# Patient Record
Sex: Male | Born: 1960 | Race: Asian | Marital: Married | Smoking: Never smoker
Health system: Southern US, Community
[De-identification: ages and names within clinical notes are randomized; demographics above are authoritative.]

## PROBLEM LIST (undated history)

## (undated) DIAGNOSIS — R079 Chest pain, unspecified: Secondary | ICD-10-CM

## (undated) DIAGNOSIS — I10 Essential (primary) hypertension: Secondary | ICD-10-CM

## (undated) DIAGNOSIS — K219 Gastro-esophageal reflux disease without esophagitis: Secondary | ICD-10-CM

## (undated) DIAGNOSIS — M1990 Unspecified osteoarthritis, unspecified site: Secondary | ICD-10-CM

## (undated) DIAGNOSIS — R0609 Other forms of dyspnea: Secondary | ICD-10-CM

## (undated) HISTORY — PX: FRACTURE SURGERY: SHX138

## (undated) HISTORY — PX: HIP SURGERY: SHX245

## (undated) HISTORY — DX: Other forms of dyspnea: R06.09

## (undated) HISTORY — DX: Unspecified osteoarthritis, unspecified site: M19.90

## (undated) HISTORY — DX: Chest pain, unspecified: R07.9

## (undated) HISTORY — DX: Gastro-esophageal reflux disease without esophagitis: K21.9

---

## 2012-10-02 ENCOUNTER — Encounter (HOSPITAL_COMMUNITY): Payer: Self-pay | Admitting: Emergency Medicine

## 2012-10-02 ENCOUNTER — Emergency Department (HOSPITAL_COMMUNITY): Payer: BC Managed Care – PPO

## 2012-10-02 ENCOUNTER — Observation Stay (HOSPITAL_COMMUNITY)
Admission: EM | Admit: 2012-10-02 | Discharge: 2012-10-03 | Disposition: A | Discharge status: Home / Self Care | Payer: BC Managed Care – PPO | Attending: Emergency Medicine | Admitting: Emergency Medicine

## 2012-10-02 DIAGNOSIS — Z8719 Personal history of other diseases of the digestive system: Secondary | ICD-10-CM

## 2012-10-02 DIAGNOSIS — Z79899 Other long term (current) drug therapy: Secondary | ICD-10-CM | POA: Insufficient documentation

## 2012-10-02 DIAGNOSIS — R0789 Other chest pain: Principal | ICD-10-CM | POA: Insufficient documentation

## 2012-10-02 DIAGNOSIS — I10 Essential (primary) hypertension: Secondary | ICD-10-CM | POA: Insufficient documentation

## 2012-10-02 DIAGNOSIS — E876 Hypokalemia: Secondary | ICD-10-CM | POA: Insufficient documentation

## 2012-10-02 DIAGNOSIS — R079 Chest pain, unspecified: Secondary | ICD-10-CM

## 2012-10-02 HISTORY — DX: Essential (primary) hypertension: I10

## 2012-10-02 LAB — CBC WITH DIFFERENTIAL/PLATELET
Eosinophils Relative: 2 % (ref 0–5)
HCT: 42.7 % (ref 39.0–52.0)
Hemoglobin: 15.2 g/dL (ref 13.0–17.0)
Lymphocytes Relative: 27 % (ref 12–46)
Lymphs Abs: 2 10*3/uL (ref 0.7–4.0)
MCV: 84.1 fL (ref 78.0–100.0)
Monocytes Absolute: 0.7 10*3/uL (ref 0.1–1.0)
Monocytes Relative: 10 % (ref 3–12)
Platelets: 248 10*3/uL (ref 150–400)
RBC: 5.08 MIL/uL (ref 4.22–5.81)
WBC: 7.3 10*3/uL (ref 4.0–10.5)

## 2012-10-02 LAB — COMPREHENSIVE METABOLIC PANEL
Albumin: 4.4 g/dL (ref 3.5–5.2)
BUN: 13 mg/dL (ref 6–23)
Creatinine, Ser: 0.93 mg/dL (ref 0.50–1.35)
Total Protein: 8.1 g/dL (ref 6.0–8.3)

## 2012-10-02 LAB — POCT I-STAT TROPONIN I: Troponin i, poc: 0.01 ng/mL (ref 0.00–0.08)

## 2012-10-02 MED ORDER — ASPIRIN 81 MG PO CHEW
324.0000 mg | CHEWABLE_TABLET | Freq: Once | ORAL | Status: AC
  Administered 2012-10-02: 324 mg via ORAL
  Filled 2012-10-02: qty 4

## 2012-10-02 MED ORDER — KETOROLAC TROMETHAMINE 30 MG/ML IJ SOLN
30.0000 mg | Freq: Once | INTRAMUSCULAR | Status: AC
  Administered 2012-10-02: 30 mg via INTRAVENOUS
  Filled 2012-10-02: qty 1

## 2012-10-02 MED ORDER — MORPHINE SULFATE 4 MG/ML IJ SOLN
4.0000 mg | Freq: Once | INTRAMUSCULAR | Status: AC
  Administered 2012-10-02: 4 mg via INTRAVENOUS
  Filled 2012-10-02: qty 1

## 2012-10-02 NOTE — ED Notes (Signed)
PT. REPORTS MID / LEFT CHEST PAIN " DISCOMFORT"  ONSET YESTERDAY WITH SLIGHT SOB , NO COUGH , NO NAUSEA OR DIAPHORESIS, ALSO REPORTS ELEVATED BLOOD PRESSURE TODAY = 145/98.

## 2012-10-02 NOTE — ED Notes (Signed)
Report received from Donna RN

## 2012-10-02 NOTE — ED Provider Notes (Signed)
History     CSN: [id number]  Arrival date & time 10/02/12  1955   First MD Initiated Contact with Patient 10/02/12 2031      Chief Complaint  Patient presents with  . Chest Pain    (Consider location/radiation/quality/duration/timing/severity/associated sxs/prior treatment) HPI Comments: Patient with only pmh being htn, presents with complaints of heaviness in the front of his chest since yesterday.  He denies radiation into his arm or jaw.  There is no shortness of breath and no exertional component.  He reports that he has been under a lot of stress at work recently.  He had a similar episode about 8 years ago and had a stress test that was negative.    He reports his father had heart disease diagnosed at the age of 9.  Patient is a 51 y.o. male presenting with chest pain. The history is provided by the patient.  Chest Pain The chest pain began yesterday. Chest pain occurs intermittently. The chest pain is worsening. The pain is associated with stress. The severity of the pain is moderate. The quality of the pain is described as heavy. The pain does not radiate. Chest pain is worsened by stress. Pertinent negatives for primary symptoms include no fever, no shortness of breath, no cough, no nausea and no vomiting. He tried nothing for the symptoms. Risk factors: htn, family history.     Past Medical History  Diagnosis Date  . Hypertension     Past Surgical History  Procedure Date  . Hip surgery     No family history on file.  History  Substance Use Topics  . Smoking status: Never Smoker   . Smokeless tobacco: Not on file  . Alcohol Use: No      Review of Systems  Constitutional: Negative for fever.  Respiratory: Negative for cough and shortness of breath.   Cardiovascular: Positive for chest pain.  Gastrointestinal: Negative for nausea and vomiting.  All other systems reviewed and are negative.    Allergies  Review of patient's allergies indicates not on  file.  Home Medications   Current Outpatient Rx  Name  Route  Sig  Dispense  Refill  . LISINOPRIL 10 MG PO TABS   Oral   Take 10 mg by mouth daily.         Marland Kitchen METOPROLOL SUCCINATE ER 50 MG PO TB24   Oral   Take 50 mg by mouth daily. Take with or immediately following a meal.           BP 147/94  Pulse 112  Temp 98.1 F (36.7 C) (Oral)  Resp 20  SpO2 99%  Physical Exam  Nursing note and vitals reviewed. Constitutional: He is oriented to person, place, and time. He appears well-developed and well-nourished. No distress.  HENT:  Head: Normocephalic and atraumatic.  Mouth/Throat: Oropharynx is clear and moist.  Neck: Normal range of motion. Neck supple.  Cardiovascular: Normal rate and regular rhythm.   No murmur heard. Pulmonary/Chest: Effort normal and breath sounds normal. No respiratory distress.  Abdominal: Soft. Bowel sounds are normal. He exhibits no distension. There is no tenderness.  Musculoskeletal: Normal range of motion. He exhibits no edema.  Lymphadenopathy:    He has no cervical adenopathy.  Neurological: He is alert and oriented to person, place, and time.  Skin: Skin is warm and dry. He is not diaphoretic.    ED Course  Procedures (including critical care time)   Labs Reviewed  CBC WITH DIFFERENTIAL  POCT  I-STAT TROPONIN I  COMPREHENSIVE METABOLIC PANEL  TROPONIN I   Dg Chest 2 View  10/02/2012  *RADIOLOGY REPORT*  Clinical Data: 2-day history of mid chest pain.  Dizziness.  CHEST - 2 VIEW  Comparison: None.  Findings: Cardiac silhouette normal in size.  Thoracic aorta mildly tortuous.  Hilar and mediastinal contours otherwise unremarkable. Lungs clear.  Bronchovascular markings normal.  Pulmonary vascularity normal.  No pneumothorax.  No pleural effusions.  Mild degenerative changes involving the thoracic spine.  IMPRESSION: No acute cardiopulmonary disease.   Original Report Authenticated By: Hulan Saas, M.D.      No diagnosis  found.   Date: 10/02/2012  Rate: 112  Rhythm: sinus tachycardia  QRS Axis: normal  Intervals: normal  ST/T Wave abnormalities: normal  Conduction Disutrbances:none  Narrative Interpretation:   Old EKG Reviewed: none available    MDM  The patient presents with chest pain and risk factors of htn and family history.  The workup is negative and he is pain-free with toradol and morphine.  I have discussed this with him and we have decided to do cdu observation.  His bmi is 29.6.  Report given to D. Smith.        Geoffery Lyons, MD 10/02/12 [phone]

## 2012-10-03 ENCOUNTER — Encounter (HOSPITAL_COMMUNITY): Payer: Self-pay

## 2012-10-03 ENCOUNTER — Observation Stay (HOSPITAL_COMMUNITY): Payer: BC Managed Care – PPO

## 2012-10-03 MED ORDER — SODIUM CHLORIDE 0.9 % IV SOLN
20.0000 mL | INTRAVENOUS | Status: DC
  Administered 2012-10-03: 20 mL via INTRAVENOUS

## 2012-10-03 MED ORDER — METOPROLOL TARTRATE 1 MG/ML IV SOLN
INTRAVENOUS | Status: AC
  Filled 2012-10-03: qty 10

## 2012-10-03 MED ORDER — OMEPRAZOLE 20 MG PO CPDR: 20.0000 mg | DELAYED_RELEASE_CAPSULE | Freq: Every day | ORAL | Status: DC

## 2012-10-03 MED ORDER — IOHEXOL 350 MG/ML SOLN
80.0000 mL | Freq: Once | INTRAVENOUS | Status: AC | PRN
  Administered 2012-10-03: 80 mL via INTRAVENOUS

## 2012-10-03 MED ORDER — NITROGLYCERIN 0.4 MG SL SUBL
0.4000 mg | SUBLINGUAL_TABLET | Freq: Once | SUBLINGUAL | Status: AC
  Administered 2012-10-03: 0.4 mg via SUBLINGUAL

## 2012-10-03 MED ORDER — METOPROLOL TARTRATE 25 MG PO TABS
50.0000 mg | ORAL_TABLET | Freq: Once | ORAL | Status: AC
Start: 2012-10-03 — End: 2012-10-03
  Administered 2012-10-03: 50 mg via ORAL
  Filled 2012-10-03: qty 2

## 2012-10-03 MED ORDER — NITROGLYCERIN 0.4 MG SL SUBL
SUBLINGUAL_TABLET | SUBLINGUAL | Status: AC
  Administered 2012-10-03: 0.4 mg via SUBLINGUAL
  Filled 2012-10-03: qty 25

## 2012-10-03 MED ORDER — METHOCARBAMOL 500 MG PO TABS: 500.0000 mg | ORAL_TABLET | Freq: Two times a day (BID) | ORAL | Status: DC

## 2012-10-03 NOTE — ED Notes (Signed)
Pt A.O.x 4 Denies Chest pain.  NSR. Denies SOB. Denies N/V. Denies any other pain. Ambulatory with steady gait. No assist needed. Updated on plan of care:  Aware of NPO status after 0400. Aware of 0500 medication. Aware of 0600 EKG. Pt resting comfortably in bed watching tv. No further needs at this time.

## 2012-10-03 NOTE — Progress Notes (Signed)
Utilization review completed.  P.J. Chales Pelissier,RN,BSN Case Manager [phone]  

## 2012-10-03 NOTE — ED Provider Notes (Signed)
Patient moved to CDU under chest pain protocol. Per nursing staff patient slept on and off throughout the night without complaints.  Patient resting comfortably at present without return of chest pain. Lungs CTA bilaterally. S1/S2, RRR, no murmur. Abdomen soft, bowel sounds present. Strong distal pulses palpated all extremities. Sinus rhythm on monitor without ectopy. Troponin negative x 2.  12 lead reviewed, no indication of ischemia. Patient scheduled for CT coronary this AM. Diagnostic and treatment plan discussed with patient.  Questions answered.  BP 110/76  Pulse 55  Temp 98.2 F (36.8 C) (Oral)  Resp 13  Ht 5\' 7"  (1.702 m)  Wt 188 lb 7 oz (85.475 kg)  BMI 29.51 kg/m2  SpO2 99%   9:50 AM Report called from Balinda Quails, MD: No evidence for coronary artery disease.  Coronary calcium score is zero.  I discussed these findings with the patient. He states that his pain did not feel like his typical GERD pain, he states the pain was worse with movement, inspiration and palpation. I have discussed the need to followup with his primary care physician. He states he already has an appointment for December 6. I have given him a copy of all of his lab results from today. I have also given prescriptions for Robaxin and Prilosec. I have also discussed reasons to return immediately to the ER.  Patient expresses understanding and agrees with plan.  1. Medications: Robaxin for muscle spasm, Prilosec for GERD symptoms, usual home medications including the anti-inflammatory that you're already taking for your knee 2. Treatment: rest, drink plenty of fluids, take medications as prescribed 3. Follow Up: Please followup with your primary doctor for discussion of your diagnoses and further evaluation after today's visit; if you do not have a primary care doctor use the resource guide provided to find one; followup with [facility] for further evaluation of your chest pain     Dierdre Forth, PA-C 10/03/12 1010

## 2013-06-12 ENCOUNTER — Ambulatory Visit: Payer: BC Managed Care – PPO | Admitting: Internal Medicine

## 2013-07-31 ENCOUNTER — Ambulatory Visit (INDEPENDENT_AMBULATORY_CARE_PROVIDER_SITE_OTHER): Payer: BC Managed Care – PPO | Admitting: Internal Medicine

## 2013-07-31 ENCOUNTER — Encounter: Payer: Self-pay | Admitting: Internal Medicine

## 2013-07-31 VITALS — BP 118/72 | HR 67 | Temp 97.5°F | Resp 16 | Ht 67.5 in | Wt 192.0 lb

## 2013-07-31 DIAGNOSIS — Z0000 Encounter for general adult medical examination without abnormal findings: Secondary | ICD-10-CM | POA: Insufficient documentation

## 2013-07-31 DIAGNOSIS — R7309 Other abnormal glucose: Secondary | ICD-10-CM | POA: Insufficient documentation

## 2013-07-31 DIAGNOSIS — Z23 Encounter for immunization: Secondary | ICD-10-CM

## 2013-07-31 NOTE — Patient Instructions (Signed)
Health Maintenance, Males A healthy lifestyle and preventative care can promote health and wellness.  Maintain regular health, dental, and eye exams.  Eat a healthy diet. Foods like vegetables, fruits, whole grains, low-fat dairy products, and lean protein foods contain the nutrients you need without too many calories. Decrease your intake of foods high in solid fats, added sugars, and salt. Get information about a proper diet from your caregiver, if necessary.  Regular physical exercise is one of the most important things you can do for your health. Most adults should get at least 150 minutes of moderate-intensity exercise (any activity that increases your heart rate and causes you to sweat) each week. In addition, most adults need muscle-strengthening exercises on 2 or more days a week.   Maintain a healthy weight. The body mass index (BMI) is a screening tool to identify possible weight problems. It provides an estimate of body fat based on height and weight. Your caregiver can help determine your BMI, and can help you achieve or maintain a healthy weight. For adults 20 years and older:  A BMI below 18.5 is considered underweight.  A BMI of 18.5 to 24.9 is normal.  A BMI of 25 to 29.9 is considered overweight.  A BMI of 30 and above is considered obese.  Maintain normal blood lipids and cholesterol by exercising and minimizing your intake of saturated fat. Eat a balanced diet with plenty of fruits and vegetables. Blood tests for lipids and cholesterol should begin at age 20 and be repeated every 5 years. If your lipid or cholesterol levels are high, you are over 50, or you are a high risk for heart disease, you may need your cholesterol levels checked more frequently.Ongoing high lipid and cholesterol levels should be treated with medicines, if diet and exercise are not effective.  If you smoke, find out from your caregiver how to quit. If you do not use tobacco, do not start.  If you  choose to drink alcohol, do not exceed 2 drinks per day. One drink is considered to be 12 ounces (355 mL) of beer, 5 ounces (148 mL) of wine, or 1.5 ounces (44 mL) of liquor.  Avoid use of street drugs. Do not share needles with anyone. Ask for help if you need support or instructions about stopping the use of drugs.  High blood pressure causes heart disease and increases the risk of stroke. Blood pressure should be checked at least every 1 to 2 years. Ongoing high blood pressure should be treated with medicines if weight loss and exercise are not effective.  If you are 45 to 50 years old, ask your caregiver if you should take aspirin to prevent heart disease.  Diabetes screening involves taking a blood sample to check your fasting blood sugar level. This should be done once every 3 years, after age 45, if you are within normal weight and without risk factors for diabetes. Testing should be considered at a younger age or be carried out more frequently if you are overweight and have at least 1 risk factor for diabetes.  Colorectal cancer can be detected and often prevented. Most routine colorectal cancer screening begins at the age of 50 and continues through age 75. However, your caregiver may recommend screening at an earlier age if you have risk factors for colon cancer. On a yearly basis, your caregiver may provide home test kits to check for hidden blood in the stool. Use of a small camera at the end of a tube,   to directly examine the colon (sigmoidoscopy or colonoscopy), can detect the earliest forms of colorectal cancer. Talk to your caregiver about this at age 50, when routine screening begins. Direct examination of the colon should be repeated every 5 to 10 years through age 75, unless early forms of pre-cancerous polyps or small growths are found.  Hepatitis C blood testing is recommended for all people born from 1945 through 1965 and any individual with known risks for hepatitis C.  Healthy  men should no longer receive prostate-specific antigen (PSA) blood tests as part of routine cancer screening. Consult with your caregiver about prostate cancer screening.  Testicular cancer screening is not recommended for adolescents or adult males who have no symptoms. Screening includes self-exam, caregiver exam, and other screening tests. Consult with your caregiver about any symptoms you have or any concerns you have about testicular cancer.  Practice safe sex. Use condoms and avoid high-risk sexual practices to reduce the spread of sexually transmitted infections (STIs).  Use sunscreen with a sun protection factor (SPF) of 30 or greater. Apply sunscreen liberally and repeatedly throughout the day. You should seek shade when your shadow is shorter than you. Protect yourself by wearing long sleeves, pants, a wide-brimmed hat, and sunglasses year round, whenever you are outdoors.  Notify your caregiver of new moles or changes in moles, especially if there is a change in shape or color. Also notify your caregiver if a mole is larger than the size of a pencil eraser.  A one-time screening for abdominal aortic aneurysm (AAA) and surgical repair of large AAAs by sound wave imaging (ultrasonography) is recommended for ages 65 to 75 years who are current or former smokers.  Stay current with your immunizations. Document Released: 04/21/2008 Document Revised: 01/16/2012 Document Reviewed: 03/21/2011 [employer] Patient Information 2014 [employer].  

## 2013-07-31 NOTE — Assessment & Plan Note (Signed)
Exam done Vaccines were updated Labs ordered Pt ed material was given 

## 2013-07-31 NOTE — Progress Notes (Signed)
Subjective:    Patient ID: Levi Taylor, male    DOB: 1961-01-15, 52 y.o.   MRN: [id number]  HPI  New to me for a complete physical - he feels well and offers no complaints.  Review of Systems  Constitutional: Negative.  Negative for fever, chills, diaphoresis, appetite change and fatigue.  HENT: Negative.   Eyes: Negative.   Respiratory: Negative.  Negative for apnea, cough, choking, chest tightness, shortness of breath, wheezing and stridor.   Cardiovascular: Negative.  Negative for chest pain, palpitations and leg swelling.  Gastrointestinal: Negative.  Negative for nausea, vomiting, abdominal pain, diarrhea, constipation and blood in stool.  Endocrine: Negative.  Negative for polydipsia, polyphagia and polyuria.  Genitourinary: Negative.   Musculoskeletal: Negative.  Negative for back pain, joint swelling, arthralgias and gait problem.  Skin: Negative.   Allergic/Immunologic: Negative.   Neurological: Negative.  Negative for dizziness, tremors, syncope, weakness, light-headedness, numbness and headaches.  Hematological: Negative.  Negative for adenopathy. Does not bruise/bleed easily.  Psychiatric/Behavioral: Negative.        Objective:   Physical Exam  Vitals reviewed. Constitutional: He is oriented to person, place, and time. He appears well-developed and well-nourished. No distress.  HENT:  Head: Normocephalic and atraumatic.  Mouth/Throat: Oropharynx is clear and moist. No oropharyngeal exudate.  Eyes: Conjunctivae are normal. Right eye exhibits no discharge. Left eye exhibits no discharge. No scleral icterus.  Neck: Normal range of motion. Neck supple. No JVD present. No tracheal deviation present. No thyromegaly present.  Cardiovascular: Normal rate, regular rhythm, normal heart sounds and intact distal pulses.  Exam reveals no gallop and no friction rub.   No murmur heard. Pulmonary/Chest: Effort normal and breath sounds normal. No stridor. No respiratory distress.  He has no wheezes. He has no rales. He exhibits no tenderness.  Abdominal: Soft. Bowel sounds are normal. He exhibits no distension and no mass. There is no tenderness. There is no rebound and no guarding. Hernia confirmed negative in the right inguinal area and confirmed negative in the left inguinal area.  Genitourinary: Rectum normal, prostate normal, testes normal and penis normal. Rectal exam shows no external hemorrhoid, no internal hemorrhoid, no fissure, no mass, no tenderness and anal tone normal. Guaiac negative stool. Prostate is not enlarged and not tender. Right testis shows no mass, no swelling and no tenderness. Right testis is descended. Left testis shows no mass, no swelling and no tenderness. Left testis is descended. Circumcised. No penile erythema or penile tenderness. No discharge found.  Musculoskeletal: Normal range of motion. He exhibits no edema and no tenderness.  Lymphadenopathy:    He has no cervical adenopathy.       Right: No inguinal adenopathy present.       Left: No inguinal adenopathy present.  Neurological: He is oriented to person, place, and time.  Skin: Skin is warm and dry. No rash noted. He is not diaphoretic. No erythema. No pallor.  Psychiatric: He has a normal mood and affect. His behavior is normal. Judgment and thought content normal.     Lab Results  Component Value Date   WBC 7.3 10/02/2012   HGB 15.2 10/02/2012   HCT 42.7 10/02/2012   PLT 248 10/02/2012   GLUCOSE 136* 10/02/2012   ALT 45 10/02/2012   AST 36 10/02/2012   NA 139 10/02/2012   K 3.4* 10/02/2012   CL 101 10/02/2012   CREATININE 0.93 10/02/2012   BUN 13 10/02/2012   CO2 27 10/02/2012  Assessment & Plan:

## 2013-07-31 NOTE — Assessment & Plan Note (Signed)
I will check his A1C to see if he has developed DM2 

## 2013-10-28 ENCOUNTER — Ambulatory Visit: Payer: BC Managed Care – PPO | Admitting: Internal Medicine

## 2013-11-05 IMAGING — CR DG CHEST 2V
2 series · 2 of 2 positions shown · non-contrast
Comparison: None.

CLINICAL DATA: 2-day history of mid chest pain.  Dizziness.

CHEST - 2 VIEW

[w chest pa]
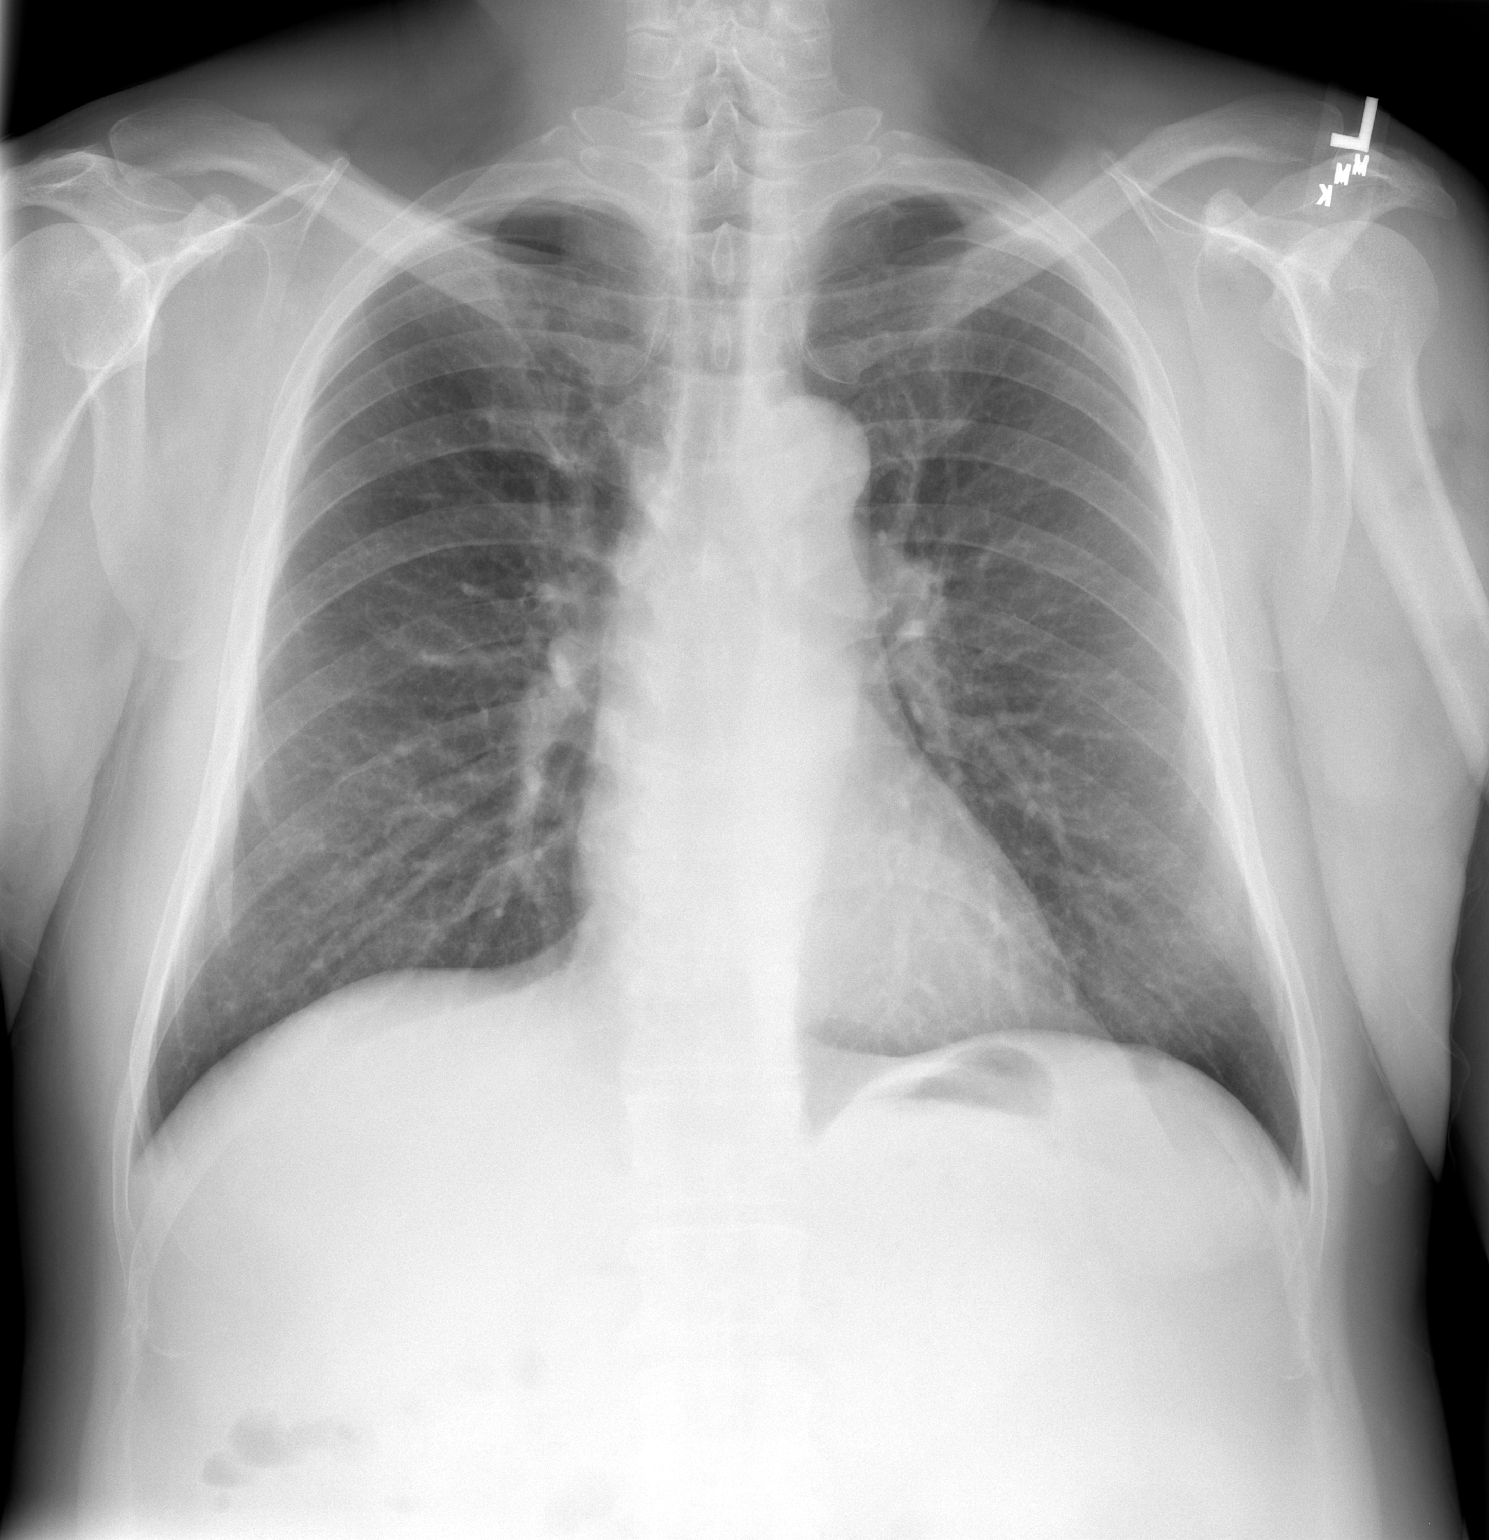

[w chest lat]
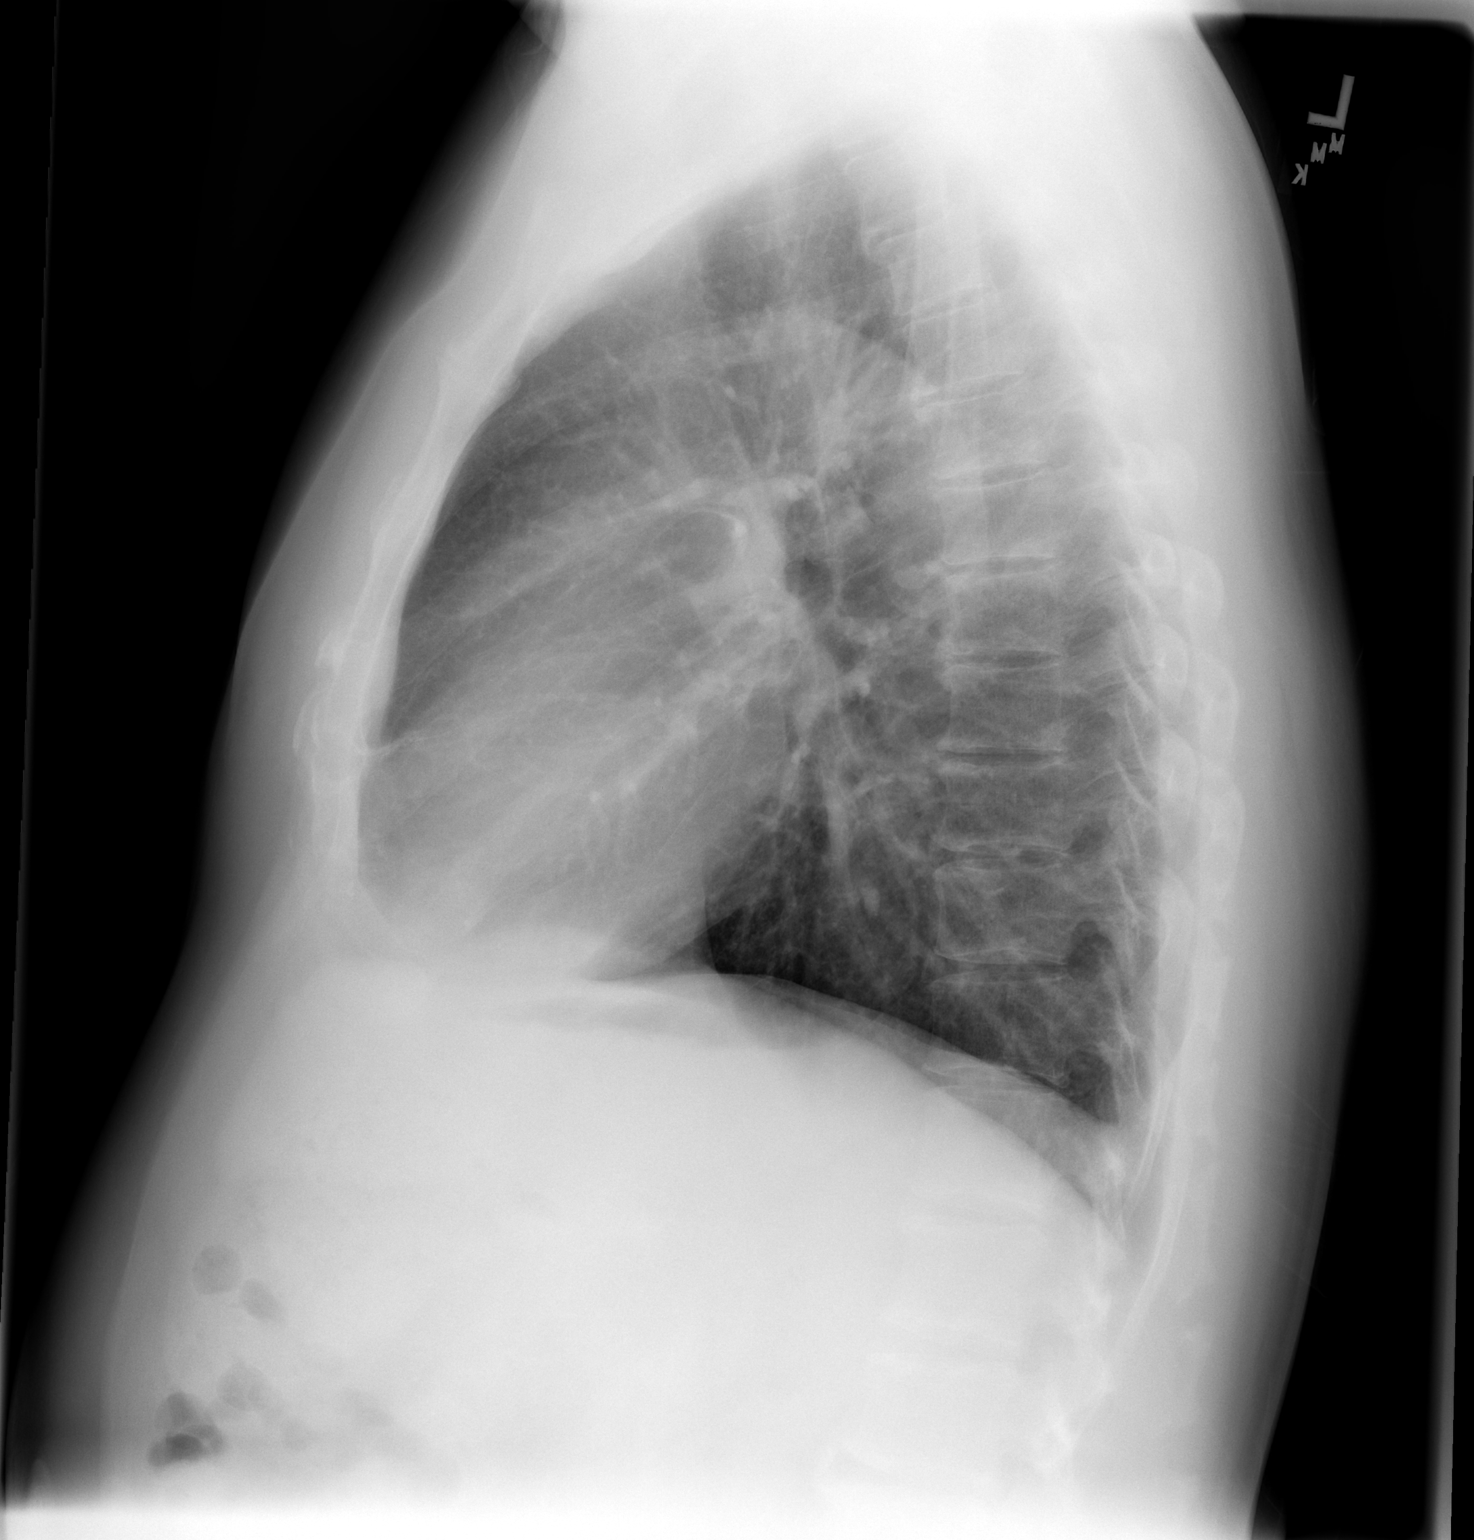

[2 of 2 positions shown; findings below may reference images not displayed]

FINDINGS: Cardiac silhouette normal in size.  Thoracic aorta mildly
tortuous.  Hilar and mediastinal contours otherwise unremarkable.
Lungs clear.  Bronchovascular markings normal.  Pulmonary
vascularity normal.  No pneumothorax.  No pleural effusions.  Mild
degenerative changes involving the thoracic spine.
IMPRESSION: No acute cardiopulmonary disease.

## 2013-11-06 IMAGING — CT CT HEART MORP W/ CTA COR W/ SCORE W/ CA W/CM &/OR W/O CM
1 of 10 series · 8 of 20 positions shown, 10 images · non-contrast
Comparison: Chest radiograph 10/02/2012

CONTRAST: 80mL OMNIPAQUE IOHEXOL 350 MG/ML SOLN

INDICATION: 51-year-old with chest pain.

CT ANGIOGRAPHY OF THE HEART, CORONARY ARTERY, STRUCTURE, AND
MORPHOLOGY
TECHNIQUE: CT angiography of the coronary vessels was performed on
a 256 channel system using prospective ECG gating.  A scout and ECG-
gated noncontrast exam (for calcium scoring) were performed.
Appropriate delay was determined by bolus tracking after injection
of iodinated contrast, and an ECG-gated coronary CTA was performed
with sub-mm slice collimation during late diastole.  Imaging post
processing was performed on an independent workstation creating
multiplanar and 3-D images, allowing for quantitative analysis of
the heart and coronary arteries.  Note that this exam targets the
heart and the chest was not imaged in its entirety.
PREMEDICATION:
Lopressor  50 mg, P.O.
Nitroglycerin  0.4 mcg, sublingual.

[Series 8: w/ edge cor., 78.0% · axial · 0.49mm/px · z∈[-211,-115]mm · 8 of 276 slices shown, 10 images]
[im 31/276  vessel]
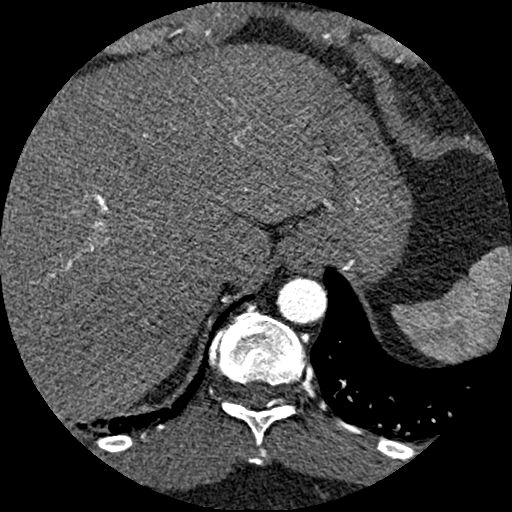
[im 31/276  lung]
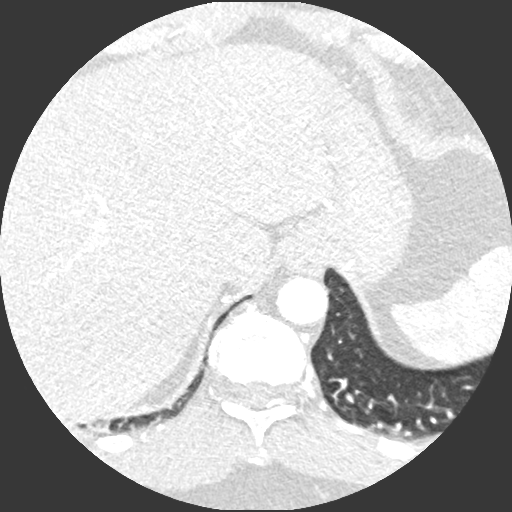
[im 62/276  vessel]
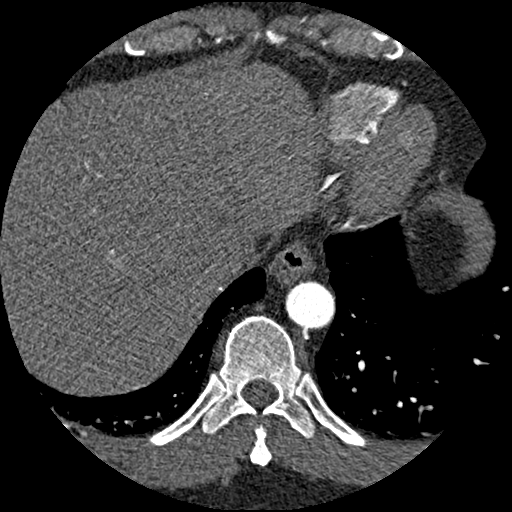
[im 92/276  vessel]
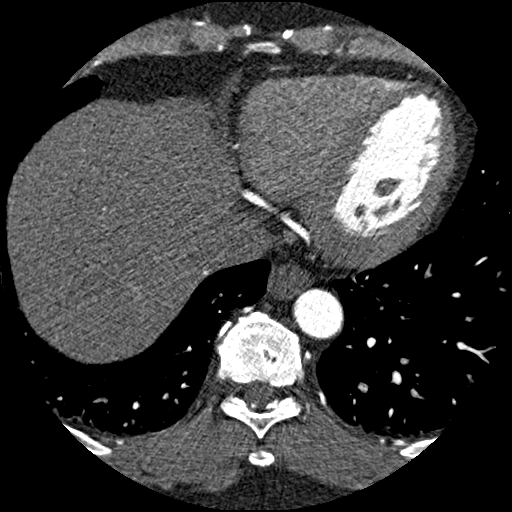
[im 123/276  vessel]
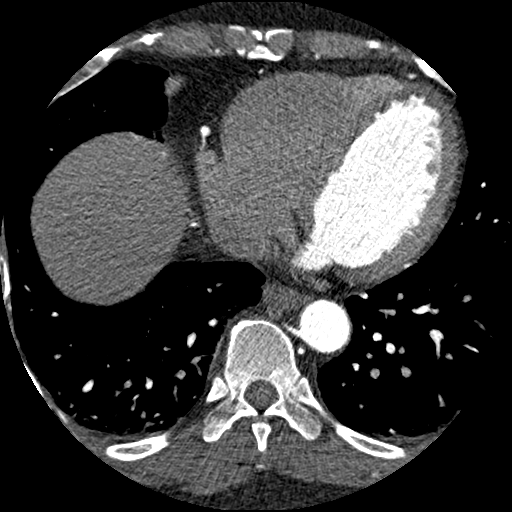
[im 153/276  vessel]
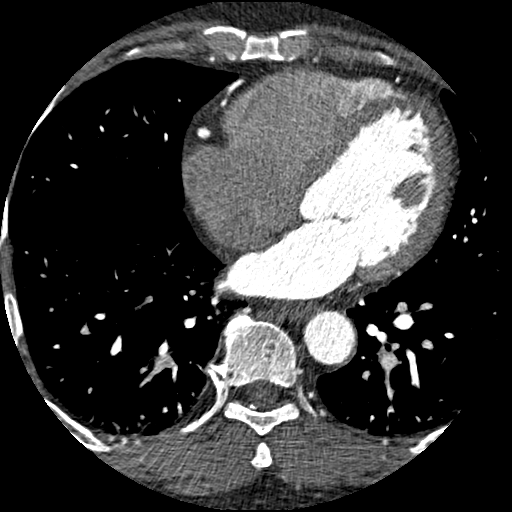
[im 153/276  lung]
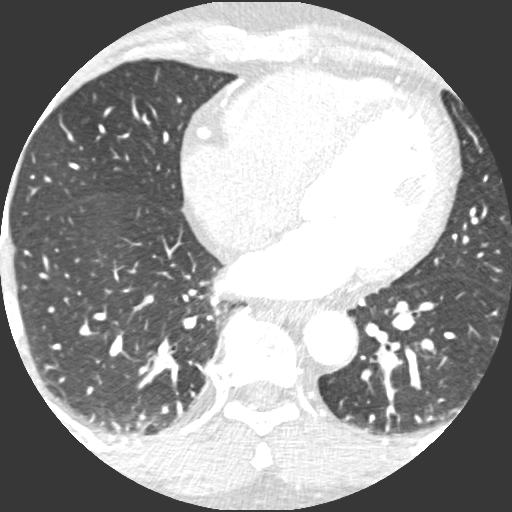
[im 184/276  vessel]
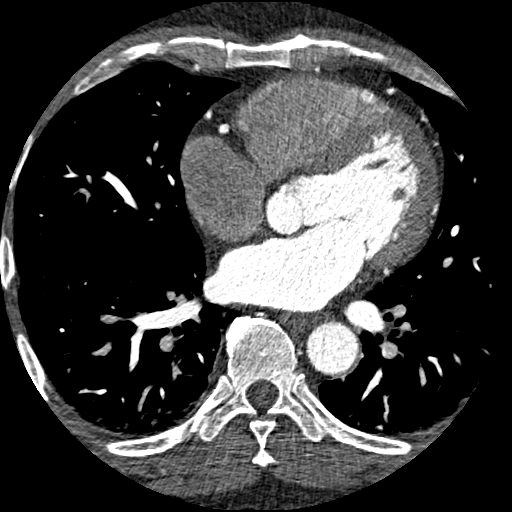
[im 214/276  vessel]
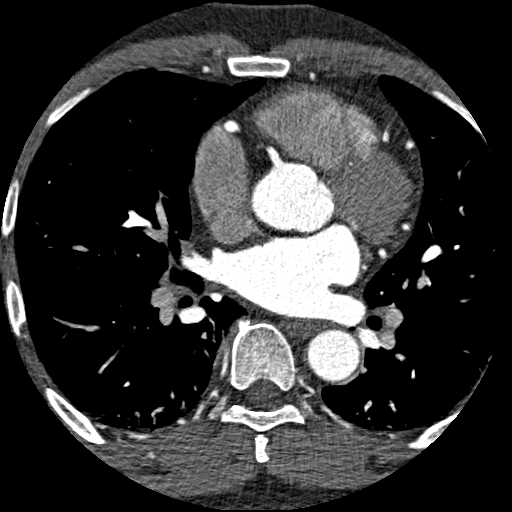
[im 245/276  vessel]
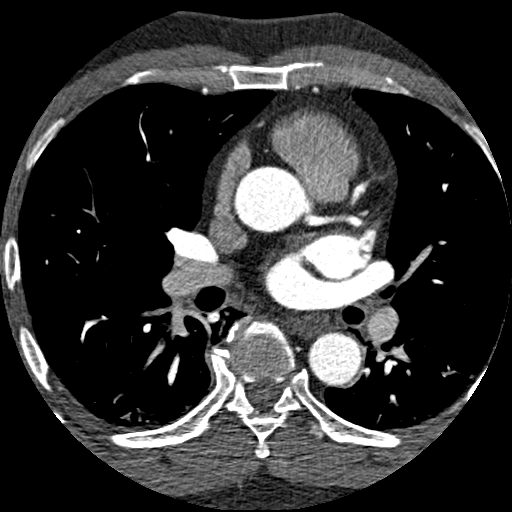

[8 of 20 positions shown; findings below may reference images not displayed]

FINDINGS: Technical quality: Good.
Heart rate:  53.

CORONARY ARTERIES:
Left Main:              Normal location and patent.
LAD:              Patent.  A small amount of motion artifact
involving the LAD just proximal to the large first diagonal branch.
There is no clear evidence for plaque or narrowing.
Diagonals:              There is a large single diagonal branch.
Evidence for a ramus intermedius branch.
LCx:              Patent.
OMs:              There are two prominent obtuse marginal branches.
RCA:              Patent
PDA:                    Patent
Dominance:        Right

CORONARY CALCIUM:
Total Agatston Score:         Zero
[HOSPITAL] percentile:     Zero

AORTA AND PULMONARY MEASUREMENTS:

Aortic root (21 - 40 mm):     27 mm at the annulus
                              38 mm at the sinuses of Valsalva
                              28 mm at the sinotubular  junction

Ascending aorta:  (< 40 mm):  33 mm
                              Descending aorta:  (< 40 mm): 25 mm

Main pulmonary artery: (< 30 mm): 21 mm

OTHER FINDINGS:
There is no significant pericardial or pleural fluid.  Images of
the upper abdomen are unremarkable.  The visualized lungs are
clear.  No suspicious bone abnormality.
IMPRESSION: No evidence for coronary artery disease.

Coronary calcium score is zero.

[DATE] a.m..

## 2013-11-15 ENCOUNTER — Other Ambulatory Visit (INDEPENDENT_AMBULATORY_CARE_PROVIDER_SITE_OTHER): Payer: BC Managed Care – PPO

## 2013-11-15 ENCOUNTER — Ambulatory Visit (INDEPENDENT_AMBULATORY_CARE_PROVIDER_SITE_OTHER): Payer: BC Managed Care – PPO | Admitting: Internal Medicine

## 2013-11-15 ENCOUNTER — Encounter: Payer: Self-pay | Admitting: Internal Medicine

## 2013-11-15 VITALS — BP 138/90 | HR 54 | Temp 98.0°F | Resp 16 | Ht 67.0 in | Wt 199.0 lb

## 2013-11-15 DIAGNOSIS — R7309 Other abnormal glucose: Secondary | ICD-10-CM

## 2013-11-15 DIAGNOSIS — Z0000 Encounter for general adult medical examination without abnormal findings: Secondary | ICD-10-CM

## 2013-11-15 DIAGNOSIS — I10 Essential (primary) hypertension: Secondary | ICD-10-CM

## 2013-11-15 HISTORY — DX: Essential (primary) hypertension: I10

## 2013-11-15 LAB — CBC WITH DIFFERENTIAL/PLATELET
Basophils Absolute: 0 10*3/uL (ref 0.0–0.1)
Basophils Relative: 0.2 % (ref 0.0–3.0)
EOS PCT: 3.9 % (ref 0.0–5.0)
Eosinophils Absolute: 0.3 10*3/uL (ref 0.0–0.7)
HEMATOCRIT: 43.2 % (ref 39.0–52.0)
HEMOGLOBIN: 14.7 g/dL (ref 13.0–17.0)
LYMPHS ABS: 2.4 10*3/uL (ref 0.7–4.0)
Lymphocytes Relative: 33.7 % (ref 12.0–46.0)
MCHC: 34.1 g/dL (ref 30.0–36.0)
MCV: 88.4 fl (ref 78.0–100.0)
MONOS PCT: 8.4 % (ref 3.0–12.0)
Monocytes Absolute: 0.6 10*3/uL (ref 0.1–1.0)
NEUTROS ABS: 3.8 10*3/uL (ref 1.4–7.7)
Neutrophils Relative %: 53.8 % (ref 43.0–77.0)
Platelets: 246 10*3/uL (ref 150.0–400.0)
RBC: 4.89 Mil/uL (ref 4.22–5.81)
RDW: 12.7 % (ref 11.5–14.6)
WBC: 7 10*3/uL (ref 4.5–10.5)

## 2013-11-15 LAB — URINALYSIS, ROUTINE W REFLEX MICROSCOPIC
Bilirubin Urine: NEGATIVE
Hgb urine dipstick: NEGATIVE
KETONES UR: NEGATIVE
LEUKOCYTES UA: NEGATIVE
Nitrite: NEGATIVE
PH: 6 (ref 5.0–8.0)
SPECIFIC GRAVITY, URINE: 1.025 (ref 1.000–1.030)
Total Protein, Urine: NEGATIVE
URINE GLUCOSE: NEGATIVE
Urobilinogen, UA: 0.2 (ref 0.0–1.0)

## 2013-11-15 LAB — COMPREHENSIVE METABOLIC PANEL
ALT: 31 U/L (ref 0–53)
AST: 23 U/L (ref 0–37)
Albumin: 4.4 g/dL (ref 3.5–5.2)
Alkaline Phosphatase: 47 U/L (ref 39–117)
BILIRUBIN TOTAL: 1 mg/dL (ref 0.3–1.2)
BUN: 11 mg/dL (ref 6–23)
CO2: 28 meq/L (ref 19–32)
CREATININE: 1 mg/dL (ref 0.4–1.5)
Calcium: 9.6 mg/dL (ref 8.4–10.5)
Chloride: 104 mEq/L (ref 96–112)
GFR: 86.99 mL/min (ref >60.00)
GLUCOSE: 91 mg/dL (ref 70–99)
Potassium: 4.3 mEq/L (ref 3.5–5.1)
Sodium: 138 mEq/L (ref 135–145)
Total Protein: 7.9 g/dL (ref 6.0–8.3)

## 2013-11-15 LAB — LIPID PANEL
CHOL/HDL RATIO: 5
CHOLESTEROL: 204 mg/dL — AB (ref 0–200)
HDL: 39.3 mg/dL (ref >39.00)
TRIGLYCERIDES: 62 mg/dL (ref 0.0–149.0)
VLDL: 12.4 mg/dL (ref 0.0–40.0)

## 2013-11-15 LAB — LDL CHOLESTEROL, DIRECT: Direct LDL: 142.5 mg/dL

## 2013-11-15 LAB — TSH: TSH: 0.94 u[IU]/mL (ref 0.35–5.50)

## 2013-11-15 LAB — HEMOGLOBIN A1C: Hgb A1c MFr Bld: 5 % (ref 4.6–6.5)

## 2013-11-15 NOTE — Assessment & Plan Note (Signed)
His BP is not well controlled He will work on his lifestyle modifications and if it is not well controlled in 2-3 months then I will change his meds

## 2013-11-15 NOTE — Patient Instructions (Signed)

## 2013-11-15 NOTE — Assessment & Plan Note (Signed)
Labs from prior CPX were not done so I reordered then today

## 2013-11-15 NOTE — Assessment & Plan Note (Signed)
I will check his A1C to see if he has developed DM2 

## 2013-11-15 NOTE — Progress Notes (Signed)
   Subjective:    Patient ID: Levi Taylor, male    DOB: [birth year], 53 y.o.   MRN: [id number]  Hypertension This is a chronic problem. The current episode started more than 1 year ago. The problem is unchanged. The problem is uncontrolled. Pertinent negatives include no anxiety, blurred vision, chest pain, headaches, malaise/fatigue, neck pain, orthopnea, palpitations, peripheral edema, PND, shortness of breath or sweats. Past treatments include beta blockers and ACE inhibitors. The current treatment provides moderate improvement. Compliance problems include exercise and diet.       Review of Systems  Constitutional: Negative.  Negative for fever, chills, malaise/fatigue, diaphoresis, appetite change and fatigue.  HENT: Negative.   Eyes: Negative.  Negative for blurred vision.  Respiratory: Negative.  Negative for cough, choking, chest tightness, shortness of breath, wheezing and stridor.   Cardiovascular: Negative.  Negative for chest pain, palpitations, orthopnea, leg swelling and PND.  Gastrointestinal: Negative.  Negative for nausea, vomiting, abdominal pain, diarrhea, constipation and blood in stool.  Endocrine: Negative.   Genitourinary: Negative.   Musculoskeletal: Negative.  Negative for neck pain.  Skin: Negative.   Allergic/Immunologic: Negative.   Neurological: Negative.  Negative for headaches.  Hematological: Negative.  Negative for adenopathy. Does not bruise/bleed easily.  Psychiatric/Behavioral: Negative.        Objective:   Physical Exam  Vitals reviewed. Constitutional: He is oriented to person, place, and time. He appears well-developed and well-nourished. No distress.  HENT:  Head: Normocephalic and atraumatic.  Mouth/Throat: Oropharynx is clear and moist. No oropharyngeal exudate.  Eyes: Conjunctivae are normal. Right eye exhibits no discharge. Left eye exhibits no discharge. No scleral icterus.  Neck: Normal range of motion. Neck supple. No JVD present. No  tracheal deviation present. No thyromegaly present.  Cardiovascular: Normal rate, regular rhythm, normal heart sounds and intact distal pulses.  Exam reveals no gallop and no friction rub.   No murmur heard. Pulmonary/Chest: Effort normal and breath sounds normal. No stridor. No respiratory distress. He has no wheezes. He has no rales. He exhibits no tenderness.  Abdominal: Soft. Bowel sounds are normal. He exhibits no distension and no mass. There is no tenderness. There is no rebound and no guarding.  Musculoskeletal: He exhibits no edema and no tenderness.  Lymphadenopathy:    He has no cervical adenopathy.  Neurological: He is oriented to person, place, and time.  Skin: Skin is warm and dry. No rash noted. He is not diaphoretic. No erythema. No pallor.  Psychiatric: He has a normal mood and affect. His behavior is normal. Judgment and thought content normal.     Lab Results  Component Value Date   WBC 7.3 10/02/2012   HGB 15.2 10/02/2012   HCT 42.7 10/02/2012   PLT 248 10/02/2012   GLUCOSE 136* 10/02/2012   ALT 45 10/02/2012   AST 36 10/02/2012   NA 139 10/02/2012   K 3.4* 10/02/2012   CL 101 10/02/2012   CREATININE 0.93 10/02/2012   BUN 13 10/02/2012   CO2 27 10/02/2012       Assessment & Plan:

## 2013-11-16 LAB — HEPATITIS C ANTIBODY: HCV AB: NEGATIVE

## 2013-11-17 ENCOUNTER — Encounter: Payer: Self-pay | Admitting: Internal Medicine

## 2014-04-07 ENCOUNTER — Telehealth: Payer: Self-pay | Admitting: Internal Medicine

## 2014-04-07 ENCOUNTER — Other Ambulatory Visit: Payer: Self-pay | Admitting: Internal Medicine

## 2014-04-07 DIAGNOSIS — I10 Essential (primary) hypertension: Secondary | ICD-10-CM

## 2014-04-07 MED ORDER — LISINOPRIL 10 MG PO TABS: 10.0000 mg | ORAL_TABLET | Freq: Every day | ORAL | Status: DC

## 2014-04-07 MED ORDER — METOPROLOL SUCCINATE ER 50 MG PO TB24: 50.0000 mg | ORAL_TABLET | Freq: Every day | ORAL | Status: DC

## 2014-04-07 NOTE — Telephone Encounter (Signed)
LMOM for pt to call the pharmacy  

## 2014-04-07 NOTE — Telephone Encounter (Signed)
Pt needs refills on lisinopril and metopropol sent to [facility] and [address].

## 2014-04-07 NOTE — Telephone Encounter (Signed)
done

## 2014-04-16 ENCOUNTER — Encounter: Payer: Self-pay | Admitting: Internal Medicine

## 2014-04-16 ENCOUNTER — Telehealth: Payer: Self-pay | Admitting: Internal Medicine

## 2014-04-16 ENCOUNTER — Ambulatory Visit (INDEPENDENT_AMBULATORY_CARE_PROVIDER_SITE_OTHER): Payer: BC Managed Care – PPO | Admitting: Internal Medicine

## 2014-04-16 VITALS — BP 138/88 | HR 67 | Temp 98.4°F | Resp 16 | Ht 67.0 in | Wt 200.4 lb

## 2014-04-16 DIAGNOSIS — I10 Essential (primary) hypertension: Secondary | ICD-10-CM

## 2014-04-16 NOTE — Progress Notes (Signed)
Pre visit review using our clinic review tool, if applicable. No additional management support is needed unless otherwise documented below in the visit note. 

## 2014-04-16 NOTE — Assessment & Plan Note (Signed)
His BP is adequately well controlled He will work on his lifestyle modifications 

## 2014-04-16 NOTE — Patient Instructions (Signed)

## 2014-04-16 NOTE — Progress Notes (Signed)
   Subjective:    Patient ID: Levi Taylor, male    DOB: May 26, 1961, 53 y.o.   MRN: [id number]  Hypertension This is a chronic problem. The current episode started more than 1 year ago. The problem has been gradually improving since onset. The problem is controlled. Pertinent negatives include no anxiety, blurred vision, chest pain, headaches, malaise/fatigue, neck pain, orthopnea, palpitations, peripheral edema, PND, shortness of breath or sweats. There are no associated agents to hypertension. Past treatments include ACE inhibitors and beta blockers. The current treatment provides moderate improvement. Compliance problems include diet and exercise.       Review of Systems  Constitutional: Negative for malaise/fatigue.  Eyes: Negative for blurred vision.  Respiratory: Negative for shortness of breath.   Cardiovascular: Negative for chest pain, palpitations, orthopnea and PND.  Musculoskeletal: Negative for neck pain.  Neurological: Negative for headaches.  All other systems reviewed and are negative.      Objective:   Physical Exam  Vitals reviewed. Constitutional: He is oriented to person, place, and time. He appears well-developed and well-nourished. No distress.  HENT:  Head: Normocephalic and atraumatic.  Mouth/Throat: Oropharynx is clear and moist. No oropharyngeal exudate.  Eyes: Conjunctivae are normal. Right eye exhibits no discharge. Left eye exhibits no discharge. No scleral icterus.  Neck: Normal range of motion. Neck supple. No JVD present. No tracheal deviation present. No thyromegaly present.  Cardiovascular: Normal rate, regular rhythm, normal heart sounds and intact distal pulses.  Exam reveals no gallop and no friction rub.   No murmur heard. Pulmonary/Chest: Effort normal and breath sounds normal. No stridor. No respiratory distress. He has no wheezes. He has no rales. He exhibits no tenderness.  Abdominal: Soft. Bowel sounds are normal. He exhibits no distension  and no mass. There is no tenderness. There is no rebound and no guarding.  Musculoskeletal: Normal range of motion. He exhibits no edema and no tenderness.  Lymphadenopathy:    He has no cervical adenopathy.  Neurological: He is oriented to person, place, and time.  Skin: Skin is warm and dry. No rash noted. He is not diaphoretic. No erythema. No pallor.     Lab Results  Component Value Date   WBC 7.0 11/15/2013   HGB 14.7 11/15/2013   HCT 43.2 11/15/2013   PLT 246.0 11/15/2013   GLUCOSE 91 11/15/2013   CHOL 204* 11/15/2013   TRIG 62.0 11/15/2013   HDL 39.30 11/15/2013   LDLDIRECT 142.5 11/15/2013   ALT 31 11/15/2013   AST 23 11/15/2013   NA 138 11/15/2013   K 4.3 11/15/2013   CL 104 11/15/2013   CREATININE 1.0 11/15/2013   BUN 11 11/15/2013   CO2 28 11/15/2013   TSH 0.94 11/15/2013   HGBA1C 5.0 11/15/2013       Assessment & Plan:

## 2014-04-16 NOTE — Telephone Encounter (Signed)
Relevant patient education assigned to patient using Emmi. ° °

## 2014-10-13 ENCOUNTER — Other Ambulatory Visit: Payer: Self-pay | Admitting: Internal Medicine

## 2022-02-10 ENCOUNTER — Telehealth: Payer: Self-pay | Admitting: Gastroenterology

## 2022-02-10 NOTE — Telephone Encounter (Signed)
Good afternoon Dr. Tomasa Rand: ? ?(Doc of the Day 02/10/22, p.m.) ? ?This patient called and is requesting a transfer of care to our office.  He has most recently been followed by Dr. Kinnie Scales of [facility].  Dr. Kinnie Scales is retiring and he is looking for another gastroenterologist.  He has also moved and our office is closer and more convenient for him.  His records are in Epic for your review.  Please let me know if you approve the transfer.   ? ?Thank you. ?

## 2022-03-09 ENCOUNTER — Encounter: Payer: Self-pay | Admitting: Gastroenterology

## 2022-03-31 ENCOUNTER — Ambulatory Visit: Payer: 59 | Admitting: Gastroenterology

## 2022-03-31 ENCOUNTER — Encounter: Payer: Self-pay | Admitting: Gastroenterology

## 2022-03-31 VITALS — BP 124/70 | HR 57 | Resp 16 | Ht 67.0 in | Wt 205.8 lb

## 2022-03-31 DIAGNOSIS — K2980 Duodenitis without bleeding: Secondary | ICD-10-CM

## 2022-03-31 DIAGNOSIS — R140 Abdominal distension (gaseous): Secondary | ICD-10-CM | POA: Diagnosis not present

## 2022-03-31 NOTE — Progress Notes (Signed)
HPI : Levi Taylor is a very pleasant 61 year old male with hypertension previously followed by Dr. Kinnie Scales who presents to our clinic to establish care.  The patient had initially been seen by Dr. Kinnie Scales with symptoms of heartburn.  He underwent an upper endoscopy in 2021 which apparently was notable for significant duodenitis.  He was recommended to repeat an upper endoscopy in 2022 to assess resolution of the duodenitis, but this never occurred. The patient has been doing well with no symptoms for quite some time, and then stopped taking his omeprazole, and then had return of his heartburn.  He just started taking his omeprazole again a week ago, and has not had any further heartburn symptoms. He is bothered by chronic bloating, gas and abdominal discomfort.  He reports that he recently was prescribed penicillin for strep throat, and this seemed to improve his bloating significantly. His bowel habits are fairly normal, with regular formed stools most days.  He denies problems with constipation or diarrhea.  No blood in the stool. He reports undergoing a colonoscopy at the same time as his upper endoscopy in 2021.  He says this colonoscopy was normal and he was recommended to repeat in 10 years.  No family history of colon cancer    Past Medical History:  Diagnosis Date   Hypertension    EGD 2021 (Dr. Kinnie Scales).  Peptic duodenitis (procedure report not available, pathology results in Epic) Colonoscopy 2021 (Dr. Kinnie Scales).  Normal per patient  Past Surgical History:  Procedure Laterality Date   HIP SURGERY     Family History  Problem Relation Age of Onset   Stroke Father    Hypertension Father    Cancer Neg Hx    Diabetes Neg Hx    Early death Neg Hx    Heart disease Neg Hx    Hyperlipidemia Neg Hx    Kidney disease Neg Hx    Alcohol abuse Neg Hx    Drug abuse Neg Hx    Social History   Tobacco Use   Smoking status: Never   Smokeless tobacco: Never  Substance Use Topics    Alcohol use: No   Drug use: No   Current Outpatient Medications  Medication Sig Dispense Refill   metoprolol succinate (TOPROL-XL) 50 MG 24 hr tablet daily.     lisinopril-hydrochlorothiazide (ZESTORETIC) 20-12.5 MG tablet Take 1 tablet by mouth daily.     Multiple Vitamin (MULTIVITAMIN) tablet Take 1 tablet by mouth daily.     omeprazole (PRILOSEC) 40 MG capsule Take 40 mg by mouth daily.     No current facility-administered medications for this visit.   Allergies  Allergen Reactions   Pantoprazole Other (See Comments)    tremors     Review of Systems: All systems reviewed and negative except where noted in HPI.    No results found.  Physical Exam: BP 124/70 (BP Location: Left Arm, Patient Position: Sitting, Cuff Size: Normal)   Pulse (!) 57   Resp 16   Ht 5\' 7"  (1.702 m)   Wt 205 lb 12.8 oz (93.4 kg)   SpO2 99%   BMI 32.23 kg/m  Constitutional: Pleasant,well-developed, Asian male in no acute distress. HEENT: Normocephalic and atraumatic. Conjunctivae are normal. No scleral icterus. Neck supple.  Cardiovascular: Normal rate, regular rhythm.  Pulmonary/chest: Effort normal and breath sounds normal. No wheezing, rales or rhonchi. Abdominal: Soft, nondistended, nontender. Bowel sounds active throughout. There are no masses palpable. No hepatomegaly. Extremities: no edema Neurological: Alert and oriented to  person place and time. Skin: Skin is warm and dry. No rashes noted. Psychiatric: Normal mood and affect. Behavior is normal.  CBC    Component Value Date/Time   WBC 7.0 11/15/2013 1451   RBC 4.89 11/15/2013 1451   HGB 14.7 11/15/2013 1451   HCT 43.2 11/15/2013 1451   PLT 246.0 11/15/2013 1451   MCV 88.4 11/15/2013 1451   MCH 29.9 10/02/2012 2009   MCHC 34.1 11/15/2013 1451   RDW 12.7 11/15/2013 1451   LYMPHSABS 2.4 11/15/2013 1451   MONOABS 0.6 11/15/2013 1451   EOSABS 0.3 11/15/2013 1451   BASOSABS 0.0 11/15/2013 1451    CMP     Component Value  Date/Time   NA 138 11/15/2013 1451   K 4.3 11/15/2013 1451   CL 104 11/15/2013 1451   CO2 28 11/15/2013 1451   GLUCOSE 91 11/15/2013 1451   BUN 11 11/15/2013 1451   CREATININE 1.0 11/15/2013 1451   CALCIUM 9.6 11/15/2013 1451   PROT 7.9 11/15/2013 1451   ALBUMIN 4.4 11/15/2013 1451   AST 23 11/15/2013 1451   ALT 31 11/15/2013 1451   ALKPHOS 47 11/15/2013 1451   BILITOT 1.0 11/15/2013 1451   GFRNONAA >90 10/02/2012 2009   GFRAA >90 10/02/2012 2009     ASSESSMENT AND PLAN: 61 year old male with chronic GERD symptoms as well as abdominal bloating and abdominal discomfort, previously noted to have what was described as marked duodenitis on upper endoscopy in 2021.  He had been recommended to repeat upper endoscopy in 2022, but this was never completed.  Currently, his GERD symptoms are well controlled as long as he is taking the omeprazole.  He does continue to have significant bloating and gas despite the omeprazole.  He notes that the symptoms improved significantly when he was being treated for strep throat.  This brings into question possibility of SIBO.  We will obtain breath testing to assess for SIBO, and treat with antibiotics if positive.  We will plan to repeat upper endoscopy as it was originally planned by Dr. Kinnie Scales to assess improvement in his duodenitis.  Duodenitis - Repeat upper endoscopy  Bloating - Hydrogen breath test - Gastric biopsies to exclude H. pylori at time of upper endoscopy  The details, risks (including bleeding, perforation, infection, missed lesions, medication reactions and possible hospitalization or surgery if complications occur), benefits, and alternatives to EGD with possible biopsy and possible dilation were discussed with the patient and he consents to proceed.   Kaylee Wombles E. Tomasa Rand, MD [facility]   No ref. provider found

## 2022-03-31 NOTE — Patient Instructions (Addendum)
If you are age 59 or older, your body mass index should be between 23-30. Your Body mass index is 32.23 kg/m. If this is out of the aforementioned range listed, please consider follow up with your Primary Care Provider.  If you are age 48 or younger, your body mass index should be between 19-25. Your Body mass index is 32.23 kg/m. If this is out of the aformentioned range listed, please consider follow up with your Primary Care Provider.   You have been scheduled for an endoscopy. Please follow written instructions given to you at your visit today. If you use inhalers (even only as needed), please bring them with you on the day of your procedure.  The [facility] providers would like to encourage you to use [facility] to communicate with providers for non-urgent requests or questions.  Due to long hold times on the telephone, sending your provider a message by [facility] may be a faster and more efficient way to get a response.  Please allow 48 business hours for a response.  Please remember that this is for non-urgent requests.   It was a pleasure to see you today!  Thank you for trusting me with your gastrointestinal care!    Scott E.Candis Schatz, MD

## 2022-05-03 ENCOUNTER — Encounter: Payer: Self-pay | Admitting: Gastroenterology

## 2022-05-03 ENCOUNTER — Ambulatory Visit (AMBULATORY_SURGERY_CENTER): Payer: 59 | Admitting: Gastroenterology

## 2022-05-03 VITALS — BP 103/69 | HR 56 | Temp 97.8°F | Resp 10 | Ht 67.0 in | Wt 205.0 lb

## 2022-05-03 DIAGNOSIS — R140 Abdominal distension (gaseous): Secondary | ICD-10-CM

## 2022-05-03 DIAGNOSIS — K2980 Duodenitis without bleeding: Secondary | ICD-10-CM

## 2022-05-03 MED ORDER — OMEPRAZOLE 20 MG PO CPDR: 20.0000 mg | DELAYED_RELEASE_CAPSULE | Freq: Every day | ORAL | 3 refills | Status: DC

## 2022-05-03 MED ORDER — SODIUM CHLORIDE 0.9 % IV SOLN: 500.0000 mL | INTRAVENOUS | Status: DC

## 2022-05-04 ENCOUNTER — Telehealth: Payer: Self-pay

## 2022-05-04 NOTE — Telephone Encounter (Signed)
Attempted f/u call. No answer, left Voicemail. 

## 2022-05-13 ENCOUNTER — Ambulatory Visit: Payer: 59 | Admitting: Family Medicine

## 2022-05-13 ENCOUNTER — Encounter: Payer: Self-pay | Admitting: Family Medicine

## 2022-05-13 VITALS — BP 136/84 | HR 72 | Temp 98.1°F | Ht 67.5 in | Wt 206.6 lb

## 2022-05-13 DIAGNOSIS — M10071 Idiopathic gout, right ankle and foot: Secondary | ICD-10-CM

## 2022-05-13 DIAGNOSIS — Z23 Encounter for immunization: Secondary | ICD-10-CM

## 2022-05-13 DIAGNOSIS — B351 Tinea unguium: Secondary | ICD-10-CM | POA: Diagnosis not present

## 2022-05-13 DIAGNOSIS — E785 Hyperlipidemia, unspecified: Secondary | ICD-10-CM

## 2022-05-13 DIAGNOSIS — K219 Gastro-esophageal reflux disease without esophagitis: Secondary | ICD-10-CM

## 2022-05-13 DIAGNOSIS — I10 Essential (primary) hypertension: Secondary | ICD-10-CM

## 2022-05-13 HISTORY — DX: Hyperlipidemia, unspecified: E78.5

## 2022-05-13 NOTE — Assessment & Plan Note (Addendum)
Chronic Asymptomatic Well-controlled on lisinopril 20 mg/HCTZ 12.5 mg and metformin 50 mg

## 2022-05-13 NOTE — Assessment & Plan Note (Signed)
Trial OTC Lotrimin

## 2022-05-13 NOTE — Assessment & Plan Note (Signed)
Improved Has used indomethacin and colchicine as needed Follow-up 1 month check uric acid

## 2022-05-13 NOTE — Patient Instructions (Signed)
Try lotrimin for nail fungus

## 2022-05-13 NOTE — Assessment & Plan Note (Signed)
Follows with GI Controlled on omeprazole 20 mg daily

## 2022-05-13 NOTE — Progress Notes (Signed)
   Levi Taylor is a 61 y.o. male who presents today for an office visit.  Assessment/Plan:   Problem List Items Addressed This Visit       Cardiovascular and Mediastinum   Essential hypertension, benign    Chronic Asymptomatic Well-controlled on lisinopril 20 mg/HCTZ 12.5 mg and metformin 50 mg        Digestive   Gastroesophageal reflux disease    Follows with GI Controlled on omeprazole 20 mg daily        Musculoskeletal and Integument   Idiopathic gout of right foot    Improved Has used indomethacin and colchicine as needed Follow-up 1 month check uric acid       Onychomycosis    Trial OTC Lotrimin        Other   Need for shingles vaccine - Primary   Relevant Orders   Varicella-zoster vaccine IM (Shingrix) (Completed)   Hyperlipidemia    Recheck lipid panel in 1 month.          Subjective:  HPI:  Levi Taylor is a 61 y.o. male who has Essential hypertension, benign; Need for shingles vaccine; Idiopathic gout of right foot; Gastroesophageal reflux disease; Onychomycosis; and Hyperlipidemia on their problem list..   He  has a past medical history of Hypertension.Marland Kitchen   He presents with chief complaint of Establish Care (New pt. Pt c/o hiatal hernia dx date 05/03/2022, gout dx date 04/24/2022. Pt has presented paperwork from each establishment providing information regarding these matters ) .   Patient presents today to establish care.    Patient is long standing history of hypertension.  Patient treated on lisinopril/hydrochlorothiazide and metoprolol.  CMP in 2022 was unremarkable.  No chest pain, shortness of breath, lower extremity swelling, headaches, blurry vision  Patient recently had follow-up with GI for duodenitis and GERD.  Status post EGD in 04/2022, duodenitis had resolved.  GERD controlled on omeprazole.  Patient with a gout flare approximate 1 week ago, diagnosed in urgent care.  Patient brought in next visit and to review.  Patient got  significant improvement status post indomethacin and colchicine.  Patient has history of onychomycosis on his left foot.  This is longstanding for many years and recurrent.  He has not tried any over-the-counter medications at this time.  Patient with history of hyperlipidemia.  LDL in 2015 was 142.       Objective:  Physical Exam: BP 136/84 (BP Location: Left Arm, Patient Position: Sitting, Cuff Size: Large)   Pulse 72   Temp 98.1 F (36.7 C) (Oral)   Ht 5' 7.5" (1.715 m)   Wt 206 lb 9.6 oz (93.7 kg)   SpO2 99%   BMI 31.88 kg/m    Gen: NAD, resting comfortably CV: RRR with no murmurs appreciated Pulm: NWOB, CTAB with no crackles, wheezes, or rhonchi GI: Normal bowel sounds present. Soft, Nontender, Nondistended. MSK: no edema, cyanosis, or clubbing noted Skin: warm, dry Neuro: grossly normal, moves all extremities Psych: Normal affect and thought content        Garner Nash, MD, MS

## 2022-05-13 NOTE — Assessment & Plan Note (Signed)
Recheck lipid panel in 1 month.

## 2022-06-10 ENCOUNTER — Encounter: Payer: Self-pay | Admitting: Family Medicine

## 2022-06-10 ENCOUNTER — Ambulatory Visit (INDEPENDENT_AMBULATORY_CARE_PROVIDER_SITE_OTHER): Payer: 59 | Admitting: Family Medicine

## 2022-06-10 VITALS — BP 134/84 | HR 64 | Temp 97.5°F | Ht 70.0 in | Wt 205.2 lb

## 2022-06-10 DIAGNOSIS — Z6831 Body mass index (BMI) 31.0-31.9, adult: Secondary | ICD-10-CM

## 2022-06-10 DIAGNOSIS — Z114 Encounter for screening for human immunodeficiency virus [HIV]: Secondary | ICD-10-CM

## 2022-06-10 DIAGNOSIS — E669 Obesity, unspecified: Secondary | ICD-10-CM

## 2022-06-10 DIAGNOSIS — I10 Essential (primary) hypertension: Secondary | ICD-10-CM

## 2022-06-10 DIAGNOSIS — M1A0710 Idiopathic chronic gout, right ankle and foot, without tophus (tophi): Secondary | ICD-10-CM

## 2022-06-10 DIAGNOSIS — Z0000 Encounter for general adult medical examination without abnormal findings: Secondary | ICD-10-CM

## 2022-06-10 DIAGNOSIS — K219 Gastro-esophageal reflux disease without esophagitis: Secondary | ICD-10-CM

## 2022-06-10 MED ORDER — OMEPRAZOLE 20 MG PO CPDR: 20.0000 mg | DELAYED_RELEASE_CAPSULE | Freq: Every day | ORAL | 3 refills | Status: DC

## 2022-06-10 MED ORDER — LISINOPRIL-HYDROCHLOROTHIAZIDE 20-12.5 MG PO TABS: 1.0000 | ORAL_TABLET | Freq: Every day | ORAL | 3 refills | Status: DC

## 2022-06-10 MED ORDER — METOPROLOL SUCCINATE ER 50 MG PO TB24
50.0000 mg | ORAL_TABLET | Freq: Every day | ORAL | 3 refills | Status: DC
Start: 2022-06-10 — End: 2023-03-17

## 2022-06-10 MED ORDER — COLCHICINE 0.6 MG PO TABS: 0.6000 mg | ORAL_TABLET | ORAL | 0 refills | Status: DC | PRN

## 2022-06-10 NOTE — Patient Instructions (Signed)
Health Maintenance, Male Adopting a healthy lifestyle and getting preventive care are important in promoting health and wellness. Ask your health care provider about: The right schedule for you to have regular tests and exams. Things you can do on your own to prevent diseases and keep yourself healthy. What should I know about diet, weight, and exercise? Eat a healthy diet  Eat a diet that includes plenty of vegetables, fruits, low-fat dairy products, and lean protein. Do not eat a lot of foods that are high in solid fats, added sugars, or sodium. Maintain a healthy weight Body mass index (BMI) is a measurement that can be used to identify possible weight problems. It estimates body fat based on height and weight. Your health care provider can help determine your BMI and help you achieve or maintain a healthy weight. Get regular exercise Get regular exercise. This is one of the most important things you can do for your health. Most adults should: Exercise for at least 150 minutes each week. The exercise should increase your heart rate and make you sweat (moderate-intensity exercise). Do strengthening exercises at least twice a week. This is in addition to the moderate-intensity exercise. Spend less time sitting. Even light physical activity can be beneficial. Watch cholesterol and blood lipids Have your blood tested for lipids and cholesterol at 59 years of age, then have this test every 5 years. You may need to have your cholesterol levels checked more often if: Your lipid or cholesterol levels are high. You are older than 59 years of age. You are at high risk for heart disease. What should I know about cancer screening? Many types of cancers can be detected early and may often be prevented. Depending on your health history and family history, you may need to have cancer screening at various ages. This may include screening for: Colorectal cancer. Prostate cancer. Skin cancer. Lung  cancer. What should I know about heart disease, diabetes, and high blood pressure? Blood pressure and heart disease High blood pressure causes heart disease and increases the risk of stroke. This is more likely to develop in people who have high blood pressure readings or are overweight. Talk with your health care provider about your target blood pressure readings. Have your blood pressure checked: Every 3-5 years if you are 18-39 years of age. Every year if you are 40 years old or older. If you are between the ages of 65 and 75 and are a current or former smoker, ask your health care provider if you should have a one-time screening for abdominal aortic aneurysm (AAA). Diabetes Have regular diabetes screenings. This checks your fasting blood sugar level. Have the screening done: Once every three years after age 45 if you are at a normal weight and have a low risk for diabetes. More often and at a younger age if you are overweight or have a high risk for diabetes. What should I know about preventing infection? Hepatitis B If you have a higher risk for hepatitis B, you should be screened for this virus. Talk with your health care provider to find out if you are at risk for hepatitis B infection. Hepatitis C Blood testing is recommended for: Everyone born from 1945 through 1965. Anyone with known risk factors for hepatitis C. Sexually transmitted infections (STIs) You should be screened each year for STIs, including gonorrhea and chlamydia, if: You are sexually active and are younger than 59 years of age. You are older than 59 years of age and your   health care provider tells you that you are at risk for this type of infection. Your sexual activity has changed since you were last screened, and you are at increased risk for chlamydia or gonorrhea. Ask your health care provider if you are at risk. Ask your health care provider about whether you are at high risk for HIV. Your health care provider  may recommend a prescription medicine to help prevent HIV infection. If you choose to take medicine to prevent HIV, you should first get tested for HIV. You should then be tested every 3 months for as long as you are taking the medicine. Follow these instructions at home: Alcohol use Do not drink alcohol if your health care provider tells you not to drink. If you drink alcohol: Limit how much you have to 0-2 drinks a day. Know how much alcohol is in your drink. In the [place] one drink equals one 12 oz bottle of beer (355 mL), one 5 oz glass of wine (148 mL), or one 1 oz glass of hard liquor (44 mL). Lifestyle Do not use any products that contain nicotine or tobacco. These products include cigarettes, chewing tobacco, and vaping devices, such as e-cigarettes. If you need help quitting, ask your health care provider. Do not use street drugs. Do not share needles. Ask your health care provider for help if you need support or information about quitting drugs. General instructions Schedule regular health, dental, and eye exams. Stay current with your vaccines. Tell your health care provider if: You often feel depressed. You have ever been abused or do not feel safe at home. Summary Adopting a healthy lifestyle and getting preventive care are important in promoting health and wellness. Follow your health care provider's instructions about healthy diet, exercising, and getting tested or screened for diseases. Follow your health care provider's instructions on monitoring your cholesterol and blood pressure. This information is not intended to replace advice given to you by your health care provider. Make sure you discuss any questions you have with your health care provider. Document Revised: 03/15/2021 Document Reviewed: 03/15/2021 Elsevier Patient Education  2023 [employer]     Why follow it? Research shows. Those who follow the Mediterranean diet have a reduced risk of heart disease  The  diet is associated with a reduced incidence of Parkinson's and Alzheimer's diseases People following the diet may have longer life expectancies and lower rates of chronic diseases  The Dietary Guidelines for Americans recommends the Mediterranean diet as an eating plan to promote health and prevent disease  What Is the Mediterranean Diet?  Healthy eating plan based on typical foods and recipes of Mediterranean-style cooking The diet is primarily a plant based diet; these foods should make up a majority of meals   Starches - Plant based foods should make up a majority of meals - They are an important sources of vitamins, minerals, energy, antioxidants, and fiber - Choose whole grains, foods high in fiber and minimally processed items  - Typical grain sources include wheat, oats, barley, corn, brown rice, bulgar, farro, millet, polenta, couscous  - Various types of beans include chickpeas, lentils, fava beans, black beans, white beans   Fruits  Veggies - Large quantities of antioxidant rich fruits & veggies; 6 or more servings  - Vegetables can be eaten raw or lightly drizzled with oil and cooked  - Vegetables common to the traditional Mediterranean Diet include: artichokes, arugula, beets, broccoli, brussel sprouts, cabbage, carrots, celery, collard greens, cucumbers, eggplant, kale, leeks,   lemons, lettuce, mushrooms, okra, onions, peas, peppers, potatoes, pumpkin, radishes, rutabaga, shallots, spinach, sweet potatoes, turnips, zucchini - Fruits common to the Mediterranean Diet include: apples, apricots, avocados, cherries, clementines, dates, figs, grapefruits, grapes, melons, nectarines, oranges, peaches, pears, pomegranates, strawberries, tangerines  Fats - Replace butter and margarine with healthy oils, such as olive oil, canola oil, and tahini  - Limit nuts to no more than a handful a day  - Nuts include walnuts, almonds, pecans, pistachios, pine nuts  - Limit or avoid candied, honey roasted  or heavily salted nuts - Olives are central to the Mediterranean diet - can be eaten whole or used in a variety of dishes   Meats Protein - Limiting red meat: no more than a few times a month - When eating red meat: choose lean cuts and keep the portion to the size of deck of cards - Eggs: approx. 0 to 4 times a week  - Fish and lean poultry: at least 2 a week  - Healthy protein sources include, chicken, turkey, lean beef, lamb - Increase intake of seafood such as tuna, salmon, trout, mackerel, shrimp, scallops - Avoid or limit high fat processed meats such as sausage and bacon  Dairy - Include moderate amounts of low fat dairy products  - Focus on healthy dairy such as fat free yogurt, skim milk, low or reduced fat cheese - Limit dairy products higher in fat such as whole or 2% milk, cheese, ice cream  Alcohol - Moderate amounts of red wine is ok  - No more than 5 oz daily for women (all ages) and men older than age 65  - No more than 10 oz of wine daily for men younger than 65  Other - Limit sweets and other desserts  - Use herbs and spices instead of salt to flavor foods  - Herbs and spices common to the traditional Mediterranean Diet include: basil, bay leaves, chives, cloves, cumin, fennel, garlic, lavender, marjoram, mint, oregano, parsley, pepper, rosemary, sage, savory, sumac, tarragon, thyme   It's not just a diet, it's a lifestyle:  The Mediterranean diet includes lifestyle factors typical of those in the region  Foods, drinks and meals are best eaten with others and savored Daily physical activity is important for overall good health This could be strenuous exercise like running and aerobics This could also be more leisurely activities such as walking, housework, yard-work, or taking the stairs Moderation is the key; a balanced and healthy diet accommodates most foods and drinks Consider portion sizes and frequency of consumption of certain foods   Meal Ideas & Options:   Breakfast:  Whole wheat toast or whole wheat English muffins with peanut butter & hard boiled egg Steel cut oats topped with apples & cinnamon and skim milk  Fresh fruit: banana, strawberries, melon, berries, peaches  Smoothies: strawberries, bananas, greek yogurt, peanut butter Low fat greek yogurt with blueberries and granola  Egg white omelet with spinach and mushrooms Breakfast couscous: whole wheat couscous, apricots, skim milk, cranberries  Sandwiches:  Hummus and grilled vegetables (peppers, zucchini, squash) on whole wheat bread   Grilled chicken on whole wheat pita with lettuce, tomatoes, cucumbers or tzatziki  Tuna salad on whole wheat bread: tuna salad made with greek yogurt, olives, red peppers, capers, green onions Garlic rosemary lamb pita: lamb sauted with garlic, rosemary, salt & pepper; add lettuce, cucumber, greek yogurt to pita - flavor with lemon juice and black pepper  Seafood:  Mediterranean grilled salmon, seasoned with garlic,   basil, parsley, lemon juice and black pepper Shrimp, lemon, and spinach whole-grain pasta salad made with low fat greek yogurt  Seared scallops with lemon orzo  Seared tuna steaks seasoned salt, pepper, coriander topped with tomato mixture of olives, tomatoes, olive oil, minced garlic, parsley, green onions and cappers  Meats:  Herbed greek chicken salad with kalamata olives, cucumber, feta  Red bell peppers stuffed with spinach, bulgur, lean ground beef (or lentils) & topped with feta   Kebabs: skewers of chicken, tomatoes, onions, zucchini, squash  Turkey burgers: made with red onions, mint, dill, lemon juice, feta cheese topped with roasted red peppers Vegetarian Cucumber salad: cucumbers, artichoke hearts, celery, red onion, feta cheese, tossed in olive oil & lemon juice  Hummus and whole grain pita points with a greek salad (lettuce, tomato, feta, olives, cucumbers, red onion) Lentil soup with celery, carrots made with vegetable broth,  garlic, salt and pepper  Tabouli salad: parsley, bulgur, mint, scallions, cucumbers, tomato, radishes, lemon juice, olive oil, salt and pepper.      

## 2022-06-10 NOTE — Progress Notes (Signed)
Chief Complaint:  Levi Taylor is a 61 y.o. male who presents today for his annual comprehensive physical exam.    Assessment/Plan:   Problem List Items Addressed This Visit       Cardiovascular and Mediastinum   Essential hypertension, benign   Relevant Medications   lisinopril-hydrochlorothiazide (ZESTORETIC) 20-12.5 MG tablet   metoprolol succinate (TOPROL-XL) 50 MG 24 hr tablet   Other Relevant Orders   CBC with Differential   Comprehensive metabolic panel   Hemoglobin A1c   Lipid panel   TSH     Digestive   Gastroesophageal reflux disease   Relevant Medications   omeprazole (PRILOSEC) 20 MG capsule     Musculoskeletal and Integument   Idiopathic gout of right foot   Relevant Medications   colchicine 0.6 MG tablet   Other Relevant Orders   Uric acid   Other Visit Diagnoses     Routine general medical examination at a health care facility    -  Primary   Relevant Orders   CBC with Differential   Comprehensive metabolic panel   Hemoglobin A1c   HIV antibody   Lipid panel   TSH   Screening for HIV (human immunodeficiency virus)       Relevant Orders   HIV antibody   Class 1 obesity without serious comorbidity with body mass index (BMI) of 31.0 to 31.9 in adult, unspecified obesity type       Relevant Orders   CBC with Differential   Comprehensive metabolic panel   Hemoglobin A1c   Lipid panel   TSH   Vitamin D 1,25 dihydroxy        Patient Counseling(The following topics were reviewed and/or handout was given):  -Nutrition: Stressed importance of moderation in sodium/caffeine intake, saturated fat and cholesterol, caloric balance, sufficient intake of fresh fruits, vegetables, and fiber.  -Stressed the importance of regular exercise.   -Substance Abuse: Discussed cessation/primary prevention of tobacco, alcohol, or other drug use; driving or other dangerous activities under the influence; availability of treatment for abuse.   -Injury prevention:  Discussed safety belts, safety helmets, smoke detector, smoking near bedding or upholstery.   -Sexuality: Discussed sexually transmitted diseases, partner selection, use of condoms, avoidance of unintended pregnancy and contraceptive alternatives.   -Dental health: Discussed importance of regular tooth brushing, flossing, and dental visits.  -Health maintenance and immunizations reviewed. Please refer to Health maintenance section.  Return to care in 1 year for next preventative visit.     Subjective:  HPI:  He has no acute complaints today.    Reports GERD is well controlled on omeprazole. Reports no gout flares.  Hypertension, established problem, Stable BP Readings from Last 3 Encounters:  06/10/22 134/84  05/13/22 136/84  05/03/22 103/69   Home BP monitoring: 120s over 80s Current Medications: Lisinopril 20/HCTZ 12.5, metoprolol XR 50 mg, compliant without side effects.   ROS: Denies any chest pain, shortness of breath, dyspnea on exertion, leg edema.    Lifestyle Diet: balanced Exercise: Minimal     06/10/2022    8:26 AM  Depression screen PHQ 2/9  Decreased Interest 0  Down, Depressed, Hopeless 0  PHQ - 2 Score 0  Altered sleeping 0  Tired, decreased energy 0  Change in appetite 0  Feeling bad or failure about yourself  0  Trouble concentrating 0  Moving slowly or fidgety/restless 0  Suicidal thoughts 0  PHQ-9 Score 0  Difficult doing work/chores Not difficult at all    Health Maintenance  Due  Topic Date Due   INFLUENZA VACCINE  06/07/2022      ROS: , otherwise all systems reviewed and are negative  PMH:  The following were reviewed and entered/updated in epic: Past Medical History:  Diagnosis Date   Arthritis    GERD (gastroesophageal reflux disease)    Hypertension    Patient Active Problem List   Diagnosis Date Noted   Need for shingles vaccine 05/13/2022   Idiopathic gout of right foot 05/13/2022   Gastroesophageal reflux disease  05/13/2022   Onychomycosis 05/13/2022   Hyperlipidemia 05/13/2022   Essential hypertension, benign 11/15/2013   Past Surgical History:  Procedure Laterality Date   FRACTURE SURGERY     HIP SURGERY      Family History  Problem Relation Age of Onset   Breast cancer Mother    Stroke Father    Hypertension Father    Cancer Neg Hx    Diabetes Neg Hx    Early death Neg Hx    Heart disease Neg Hx    Hyperlipidemia Neg Hx    Kidney disease Neg Hx    Alcohol abuse Neg Hx    Drug abuse Neg Hx    Colon cancer Neg Hx    Esophageal cancer Neg Hx    Liver cancer Neg Hx    Pancreatic cancer Neg Hx    Stomach cancer Neg Hx    Rectal cancer Neg Hx     Medications- reviewed and updated Current Outpatient Medications  Medication Sig Dispense Refill   indomethacin (INDOCIN) 50 MG capsule Take 50 mg by mouth 3 (three) times daily.     Multiple Vitamin (MULTIVITAMIN) tablet Take 1 tablet by mouth daily.     colchicine 0.6 MG tablet Take 1 tablet (0.6 mg total) by mouth as needed for up to 3 days. 3 tablet 0   lisinopril-hydrochlorothiazide (ZESTORETIC) 20-12.5 MG tablet Take 1 tablet by mouth daily. 90 tablet 3   metoprolol succinate (TOPROL-XL) 50 MG 24 hr tablet Take 1 tablet (50 mg total) by mouth daily. 90 tablet 3   omeprazole (PRILOSEC) 20 MG capsule Take 1 capsule (20 mg total) by mouth daily. 90 capsule 3   No current facility-administered medications for this visit.    Allergies-reviewed and updated Allergies  Allergen Reactions   Pantoprazole Other (See Comments)    tremors    Social History   Socioeconomic History   Marital status: Single    Spouse name: Not on file   Number of children: Not on file   Years of education: Not on file   Highest education level: Not on file  Occupational History   Not on file  Tobacco Use   Smoking status: Never   Smokeless tobacco: Never  Vaping Use   Vaping Use: Never used  Substance and Sexual Activity   Alcohol use: No    Drug use: No   Sexual activity: Not Currently  Other Topics Concern   Not on file  Social History Narrative   Not on file   Social Determinants of Health   Financial Resource Strain: Not on file  Food Insecurity: Not on file  Transportation Needs: Not on file  Physical Activity: Not on file  Stress: Not on file  Social Connections: Not on file        Objective:  Physical Exam: BP 134/84 (BP Location: Left Arm, Patient Position: Sitting, Cuff Size: Large)   Pulse 64   Temp (!) 97.5 F (36.4 C) (Temporal)  Ht 5\' 10"  (1.778 m)   Wt 205 lb 3.2 oz (93.1 kg)   SpO2 98%   BMI 29.44 kg/m   Body mass index is 29.44 kg/m. Wt Readings from Last 3 Encounters:  06/10/22 205 lb 3.2 oz (93.1 kg)  05/13/22 206 lb 9.6 oz (93.7 kg)  05/03/22 205 lb (93 kg)    Gen: NAD, resting comfortably CV: RRR with no murmurs appreciated Pulm: NWOB, CTAB with no crackles, wheezes, or rhonchi GI: Normal bowel sounds present. Soft, Nontender, Nondistended. MSK: no edema, cyanosis, or clubbing noted Skin: warm, dry Neuro: grossly normal, moves all extremities Psych: Normal affect and thought content      At today's visit, we discussed treatment options, associated risk and benefits, and engage in counseling as needed.  Additionally the following were reviewed: Past medical records, past medical and surgical history, family and social background, as well as relevant laboratory results, imaging findings, and specialty notes, where applicable.  This message was generated using dictation software, and as a result, it may contain unintentional typos or errors.  Nevertheless, extensive effort was made to accurately convey at the pertinent aspects of the patient visit.    There may have been are other unrelated non-urgent complaints, but due to the busy schedule and the amount of time already spent with him, time does not permit to address these issues at today's visit. Another appointment may have or has  been requested to review these additional issues.   05/05/22, MD, MS

## 2022-08-12 ENCOUNTER — Ambulatory Visit (INDEPENDENT_AMBULATORY_CARE_PROVIDER_SITE_OTHER): Payer: 59

## 2022-08-12 DIAGNOSIS — Z23 Encounter for immunization: Secondary | ICD-10-CM

## 2022-08-12 NOTE — Progress Notes (Signed)
Per orders of Josephine Igo, MD, pt is here for his second SHINGLES VACCINE. pt received VACCINE in LEFT DELTOID at 08:06AM. Given by Marcy Salvo . Pt tolerated VACCINE well.

## 2022-09-16 ENCOUNTER — Ambulatory Visit: Payer: 59 | Admitting: Family Medicine

## 2022-09-16 ENCOUNTER — Ambulatory Visit (INDEPENDENT_AMBULATORY_CARE_PROVIDER_SITE_OTHER): Payer: 59

## 2022-09-16 ENCOUNTER — Inpatient Hospital Stay (HOSPITAL_COMMUNITY): Admission: RE | Admit: 2022-09-16 | Payer: 59 | Source: Ambulatory Visit

## 2022-09-16 ENCOUNTER — Ambulatory Visit: Payer: 59

## 2022-09-16 ENCOUNTER — Encounter: Payer: Self-pay | Admitting: Family Medicine

## 2022-09-16 ENCOUNTER — Other Ambulatory Visit (HOSPITAL_COMMUNITY): Payer: 59

## 2022-09-16 DIAGNOSIS — M25552 Pain in left hip: Secondary | ICD-10-CM

## 2022-09-16 DIAGNOSIS — E669 Obesity, unspecified: Secondary | ICD-10-CM | POA: Diagnosis not present

## 2022-09-16 DIAGNOSIS — I10 Essential (primary) hypertension: Secondary | ICD-10-CM

## 2022-09-16 DIAGNOSIS — Z6831 Body mass index (BMI) 31.0-31.9, adult: Secondary | ICD-10-CM | POA: Diagnosis not present

## 2022-09-16 DIAGNOSIS — Z114 Encounter for screening for human immunodeficiency virus [HIV]: Secondary | ICD-10-CM

## 2022-09-16 DIAGNOSIS — Z0000 Encounter for general adult medical examination without abnormal findings: Secondary | ICD-10-CM | POA: Diagnosis not present

## 2022-09-16 DIAGNOSIS — R0789 Other chest pain: Secondary | ICD-10-CM

## 2022-09-16 DIAGNOSIS — M1A0710 Idiopathic chronic gout, right ankle and foot, without tophus (tophi): Secondary | ICD-10-CM

## 2022-09-16 LAB — COMPREHENSIVE METABOLIC PANEL
ALT: 22 U/L (ref 0–53)
AST: 22 U/L (ref 0–37)
Albumin: 4.6 g/dL (ref 3.5–5.2)
Alkaline Phosphatase: 68 U/L (ref 39–117)
BUN: 14 mg/dL (ref 6–23)
CO2: 29 mEq/L (ref 19–32)
Calcium: 9.3 mg/dL (ref 8.4–10.5)
Chloride: 102 mEq/L (ref 96–112)
Creatinine, Ser: 0.97 mg/dL (ref 0.40–1.50)
GFR: 85.72 mL/min (ref >60.00)
Glucose, Bld: 110 mg/dL — ABNORMAL HIGH (ref 70–99)
Potassium: 4.3 mEq/L (ref 3.5–5.1)
Sodium: 137 mEq/L (ref 135–145)
Total Bilirubin: 0.5 mg/dL (ref 0.2–1.2)
Total Protein: 7.8 g/dL (ref 6.0–8.3)

## 2022-09-16 LAB — CBC WITH DIFFERENTIAL/PLATELET
Basophils Absolute: 0.1 10*3/uL (ref 0.0–0.1)
Basophils Relative: 0.9 % (ref 0.0–3.0)
Eosinophils Absolute: 0.3 10*3/uL (ref 0.0–0.7)
Eosinophils Relative: 4.9 % (ref 0.0–5.0)
HCT: 43.1 % (ref 39.0–52.0)
Hemoglobin: 14.3 g/dL (ref 13.0–17.0)
Lymphocytes Relative: 29.7 % (ref 12.0–46.0)
Lymphs Abs: 2 10*3/uL (ref 0.7–4.0)
MCHC: 33.3 g/dL (ref 30.0–36.0)
MCV: 88.8 fl (ref 78.0–100.0)
Monocytes Absolute: 0.7 10*3/uL (ref 0.1–1.0)
Monocytes Relative: 9.8 % (ref 3.0–12.0)
Neutro Abs: 3.7 10*3/uL (ref 1.4–7.7)
Neutrophils Relative %: 54.7 % (ref 43.0–77.0)
Platelets: 254 10*3/uL (ref 150.0–400.0)
RBC: 4.85 Mil/uL (ref 4.22–5.81)
RDW: 13.3 % (ref 11.5–15.5)
WBC: 6.8 10*3/uL (ref 4.0–10.5)

## 2022-09-16 LAB — LIPID PANEL
Cholesterol: 178 mg/dL (ref 0–200)
HDL: 46.8 mg/dL (ref >39.00)
LDL Cholesterol: 106 mg/dL — ABNORMAL HIGH (ref 0–99)
NonHDL: 130.92
Total CHOL/HDL Ratio: 4
Triglycerides: 127 mg/dL (ref 0.0–149.0)
VLDL: 25.4 mg/dL (ref 0.0–40.0)

## 2022-09-16 LAB — TSH: TSH: 1.07 u[IU]/mL (ref 0.35–5.50)

## 2022-09-16 LAB — HEMOGLOBIN A1C: Hgb A1c MFr Bld: 5.3 % (ref 4.6–6.5)

## 2022-09-16 LAB — URIC ACID: Uric Acid, Serum: 8 mg/dL — ABNORMAL HIGH (ref 4.0–7.8)

## 2022-09-16 MED ORDER — IBUPROFEN 600 MG PO TABS: 600.0000 mg | ORAL_TABLET | Freq: Three times a day (TID) | ORAL | 0 refills | Status: DC | PRN
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
## 2022-09-16 NOTE — Addendum Note (Signed)
Addended by: Varney Biles on: 09/16/2022 10:42 AM   Modules accepted: Orders

## 2022-09-16 NOTE — Addendum Note (Signed)
Addended by: Trudee Kuster on: 09/16/2022 10:23 AM   Modules accepted: Orders

## 2022-09-16 NOTE — Patient Instructions (Signed)
Use ibuprofen for pain
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
## 2022-09-16 NOTE — Addendum Note (Signed)
Addended by: Varney Biles on: 09/16/2022 01:53 PM   Modules accepted: Orders

## 2022-09-21 LAB — VITAMIN D 1,25 DIHYDROXY
Vitamin D 1, 25 (OH)2 Total: 39 pg/mL (ref 18–72)
Vitamin D2 1, 25 (OH)2: <8 pg/mL
Vitamin D3 1, 25 (OH)2: 39 pg/mL

## 2022-09-21 LAB — HIV ANTIBODY (ROUTINE TESTING W REFLEX): HIV 1&2 Ab, 4th Generation: NONREACTIVE

## 2022-10-19 ENCOUNTER — Encounter: Payer: Self-pay | Admitting: Family Medicine

## 2022-10-19 ENCOUNTER — Ambulatory Visit: Payer: 59 | Admitting: Family Medicine

## 2022-10-19 VITALS — BP 126/84 | HR 90 | Temp 99.5°F | Wt 215.2 lb

## 2022-10-19 DIAGNOSIS — J4521 Mild intermittent asthma with (acute) exacerbation: Secondary | ICD-10-CM

## 2022-10-19 DIAGNOSIS — R059 Cough, unspecified: Secondary | ICD-10-CM | POA: Diagnosis not present

## 2022-10-19 LAB — POCT INFLUENZA A/B
Influenza A, POC: NEGATIVE
Influenza B, POC: NEGATIVE

## 2022-10-19 LAB — POC COVID19 BINAXNOW: SARS Coronavirus 2 Ag: NEGATIVE

## 2022-10-19 MED ORDER — PREDNISONE 10 MG (21) PO TBPK: ORAL_TABLET | ORAL | 0 refills | Status: DC

## 2022-10-19 MED ORDER — ALBUTEROL SULFATE HFA 108 (90 BASE) MCG/ACT IN AERS: 2.0000 | INHALATION_SPRAY | Freq: Four times a day (QID) | RESPIRATORY_TRACT | 0 refills | Status: DC | PRN

## 2022-10-19 MED ORDER — PROMETHAZINE-DM 6.25-15 MG/5ML PO SYRP: 5.0000 mL | ORAL_SOLUTION | Freq: Four times a day (QID) | ORAL | 0 refills | Status: DC | PRN

## 2022-10-19 NOTE — Progress Notes (Signed)
Assessment/Plan:   Problem List Items Addressed This Visit   None Visit Diagnoses     Cough, unspecified type    -  Primary   Relevant Medications   albuterol (VENTOLIN HFA) 108 (90 Base) MCG/ACT inhaler   predniSONE (STERAPRED UNI-PAK 21 TAB) 10 MG (21) TBPK tablet   promethazine-dextromethorphan (PROMETHAZINE-DM) 6.25-15 MG/5ML syrup   Other Relevant Orders   POC COVID-19 (Completed)   POCT Influenza A/B (Completed)   Mild intermittent reactive airway disease with acute exacerbation       Relevant Medications   albuterol (VENTOLIN HFA) 108 (90 Base) MCG/ACT inhaler   predniSONE (STERAPRED UNI-PAK 21 TAB) 10 MG (21) TBPK tablet   promethazine-dextromethorphan (PROMETHAZINE-DM) 6.25-15 MG/5ML syrup       Patient presents with mild wheezing likely associated with viral respiratory infection.  Treatment as ordered.  Return to ED precautions discussed   Subjective:  HPI:  Levi Taylor is a 61 y.o. male who has Essential hypertension, benign; Idiopathic gout of right foot; Gastroesophageal reflux disease; and Hyperlipidemia on their problem list..   He  has a past medical history of Arthritis, GERD (gastroesophageal reflux disease), and Hypertension.Marland Kitchen   He presents with chief complaint of Cough (Cough x Saturday) .  Cough: Patient complains of dyspnea, nasal congestion, and nonproductive cough.  Symptoms began 5 days ago.  The cough is non-productive and is aggravated by  nurse at night  Associated symptoms include:change in voice. Patient does not have new pets. Patient does not have a history of asthma. Patient does not have a history of environmental allergens. Patient does not have recent travel. Patient does not have a history of smoking. Patient  has not previous Chest X-ray. Patient does not have had a PPD done.  Patient went to Christmas event day before symptoms started, unsure if he was exposed anyone sick.  Patient has been taking OTC Coricidin without improvement.  Patient denies any chest pain, wheezing, fevers.  Past Surgical History:  Procedure Laterality Date   FRACTURE SURGERY     HIP SURGERY      Outpatient Medications Prior to Visit  Medication Sig Dispense Refill   lisinopril-hydrochlorothiazide (ZESTORETIC) 20-12.5 MG tablet Take 1 tablet by mouth daily. 90 tablet 3   metoprolol succinate (TOPROL-XL) 50 MG 24 hr tablet Take 1 tablet (50 mg total) by mouth daily. 90 tablet 3   Multiple Vitamin (MULTIVITAMIN) tablet Take 1 tablet by mouth daily.     omeprazole (PRILOSEC) 20 MG capsule Take 1 capsule (20 mg total) by mouth daily. 90 capsule 3   colchicine 0.6 MG tablet Take 1 tablet (0.6 mg total) by mouth as needed for up to 3 days. 3 tablet 0   ibuprofen (ADVIL) 600 MG tablet Take 1 tablet (600 mg total) by mouth every 8 (eight) hours as needed. (Patient not taking: Reported on 10/19/2022) 30 tablet 0   indomethacin (INDOCIN) 50 MG capsule Take 50 mg by mouth 3 (three) times daily. (Patient not taking: Reported on 09/16/2022)     No facility-administered medications prior to visit.    Family History  Problem Relation Age of Onset   Breast cancer Mother    Stroke Father    Hypertension Father    Cancer Neg Hx    Diabetes Neg Hx    Early death Neg Hx    Heart disease Neg Hx    Hyperlipidemia Neg Hx    Kidney disease Neg Hx    Alcohol abuse Neg Hx  Drug abuse Neg Hx    Colon cancer Neg Hx    Esophageal cancer Neg Hx    Liver cancer Neg Hx    Pancreatic cancer Neg Hx    Stomach cancer Neg Hx    Rectal cancer Neg Hx     Social History   Socioeconomic History   Marital status: Single    Spouse name: Not on file   Number of children: Not on file   Years of education: Not on file   Highest education level: Not on file  Occupational History   Not on file  Tobacco Use   Smoking status: Never   Smokeless tobacco: Never  Vaping Use   Vaping Use: Never used  Substance and Sexual Activity   Alcohol use: No   Drug use: No    Sexual activity: Not Currently  Other Topics Concern   Not on file  Social History Narrative   Not on file   Social Determinants of Health   Financial Resource Strain: Not on file  Food Insecurity: Not on file  Transportation Needs: Not on file  Physical Activity: Not on file  Stress: Not on file  Social Connections: Not on file  Intimate Partner Violence: Not on file                                                                                                 Objective:  Physical Exam: BP 126/84 (BP Location: Right Arm, Patient Position: Sitting, Cuff Size: Large)   Pulse 90   Temp 99.5 F (37.5 C) (Oral)   Wt 215 lb 3.2 oz (97.6 kg)   SpO2 97%   BMI 30.88 kg/m    General: No acute distress. Awake and conversant.  Eyes: Normal conjunctiva, anicteric. Round symmetric pupils.  ENT: Hearing grossly intact. No nasal discharge.  No oropharyngeal lesions, normal tonsils, Neck: Neck is supple. No masses or thyromegaly.  No cervical lymphadenopathy Respiratory: Respirations are non-labored.  Scant wheezing throughout Skin: Warm. No rashes or ulcers.  Psych: Alert and oriented. Cooperative, Appropriate mood and affect, Normal judgment.  CV: No cyanosis or JVD, RRR no MRG MSK: Normal ambulation. No clubbing  Neuro: Sensation and CN II-XII grossly normal.   Results for orders placed or performed in visit on 10/19/22  POC COVID-19  Result Value Ref Range   SARS Coronavirus 2 Ag Negative Negative  POCT Influenza A/B  Result Value Ref Range   Influenza A, POC Negative Negative   Influenza B, POC Negative Negative        Garner Nash, MD, MS

## 2022-10-19 NOTE — Patient Instructions (Addendum)
You likely have a viral lung infection. Start prednisone as prescribed. Use albuterol and cough syrup as need for cough.   Please be sure to drink plenty of fluids.   You may take the following OTC medications to help with symptoms:  For cough, use  cough syrups or other cough suppressants.  For headache, sore throat, fevers, muscle aches, chills, other pain, take ibuprofen or tylenol  For congestion, use nasal sprays, decongestants, or antihistamines  Please follow up if no improvement.   Go to ED if you have severe chest pain, fevers, shortness of breath or other worrisome symptoms.

## 2022-12-08 ENCOUNTER — Ambulatory Visit: Payer: 59 | Admitting: Family Medicine

## 2022-12-08 ENCOUNTER — Encounter: Payer: Self-pay | Admitting: Family Medicine

## 2022-12-08 VITALS — BP 178/110 | HR 79 | Temp 97.5°F | Wt 215.2 lb

## 2022-12-08 DIAGNOSIS — R079 Chest pain, unspecified: Secondary | ICD-10-CM

## 2022-12-08 DIAGNOSIS — I10 Essential (primary) hypertension: Secondary | ICD-10-CM

## 2022-12-08 DIAGNOSIS — R002 Palpitations: Secondary | ICD-10-CM | POA: Diagnosis not present

## 2022-12-08 LAB — BRAIN NATRIURETIC PEPTIDE: Pro B Natriuretic peptide (BNP): 4 pg/mL (ref 0.0–100.0)

## 2022-12-08 LAB — CBC WITH DIFFERENTIAL/PLATELET
Basophils Absolute: 0 10*3/uL (ref 0.0–0.1)
Basophils Relative: 0.4 % (ref 0.0–3.0)
Eosinophils Absolute: 0.4 10*3/uL (ref 0.0–0.7)
Eosinophils Relative: 5.3 % — ABNORMAL HIGH (ref 0.0–5.0)
HCT: 41.7 % (ref 39.0–52.0)
Hemoglobin: 14.4 g/dL (ref 13.0–17.0)
Lymphocytes Relative: 20.5 % (ref 12.0–46.0)
Lymphs Abs: 1.6 10*3/uL (ref 0.7–4.0)
MCHC: 34.4 g/dL (ref 30.0–36.0)
MCV: 88.4 fl (ref 78.0–100.0)
Monocytes Absolute: 0.7 10*3/uL (ref 0.1–1.0)
Monocytes Relative: 9 % (ref 3.0–12.0)
Neutro Abs: 5 10*3/uL (ref 1.4–7.7)
Neutrophils Relative %: 64.8 % (ref 43.0–77.0)
Platelets: 246 10*3/uL (ref 150.0–400.0)
RBC: 4.72 Mil/uL (ref 4.22–5.81)
RDW: 13.8 % (ref 11.5–15.5)
WBC: 7.6 10*3/uL (ref 4.0–10.5)

## 2022-12-08 LAB — COMPREHENSIVE METABOLIC PANEL
ALT: 20 U/L (ref 0–53)
AST: 21 U/L (ref 0–37)
Albumin: 4.6 g/dL (ref 3.5–5.2)
Alkaline Phosphatase: 67 U/L (ref 39–117)
BUN: 15 mg/dL (ref 6–23)
CO2: 27 mEq/L (ref 19–32)
Calcium: 9.4 mg/dL (ref 8.4–10.5)
Chloride: 103 mEq/L (ref 96–112)
Creatinine, Ser: 1.51 mg/dL — ABNORMAL HIGH (ref 0.40–1.50)
GFR: 50.32 mL/min — ABNORMAL LOW (ref >60.00)
Glucose, Bld: 138 mg/dL — ABNORMAL HIGH (ref 70–99)
Potassium: 4.2 mEq/L (ref 3.5–5.1)
Sodium: 139 mEq/L (ref 135–145)
Total Bilirubin: 0.6 mg/dL (ref 0.2–1.2)
Total Protein: 7.7 g/dL (ref 6.0–8.3)

## 2022-12-08 MED ORDER — AMLODIPINE BESYLATE 10 MG PO TABS: 10.0000 mg | ORAL_TABLET | Freq: Every day | ORAL | 0 refills | Status: DC

## 2022-12-08 MED ORDER — LISINOPRIL-HYDROCHLOROTHIAZIDE 20-12.5 MG PO TABS: 2.0000 | ORAL_TABLET | Freq: Every day | ORAL | 0 refills | Status: DC

## 2022-12-08 NOTE — Assessment & Plan Note (Signed)
The primary concern is the patient's intermittent episodes of significantly elevated blood pressure despite ongoing medication.   Plan: Initially intended to increase lisinopril/HCTZ, however patient blood work showed moderate increase in creatinine from near 1-1.5, so given the potential nephrotoxic effects of lisinopril/HCTZ, we will discontinue this in favor of starting amlodipine, patient to continue metoprolol - Monitor the patient's blood pressure closely to assess the response to medication adjustment. - Review the results of the pending lab work, which may offer insight into other physiological causes of hypertension such as thyroid dysfunction or kidney issues. - If results are within normal limits, address potential anxiety as a contributing factor. - If the patient's blood pressure remains uncontrolled or chest pain recurs, consider referral to cardiology for further evaluation, including possible Zeo patch, echocardiogram, or alternative imaging as indicated.

## 2022-12-08 NOTE — Progress Notes (Signed)
Assessment/Plan:   Problem List Items Addressed This Visit       Cardiovascular and Mediastinum   Essential hypertension, benign    The primary concern is the patient's intermittent episodes of significantly elevated blood pressure despite ongoing medication.   Plan: Initially intended to increase lisinopril/HCTZ, however patient blood work showed moderate increase in creatinine from near 1-1.5, so given the potential nephrotoxic effects of lisinopril/HCTZ, we will discontinue this in favor of starting amlodipine, patient to continue metoprolol - Monitor the patient's blood pressure closely to assess the response to medication adjustment. - Review the results of the pending lab work, which may offer insight into other physiological causes of hypertension such as thyroid dysfunction or kidney issues. - If results are within normal limits, address potential anxiety as a contributing factor. - If the patient's blood pressure remains uncontrolled or chest pain recurs, consider referral to cardiology for further evaluation, including possible Zeo patch, echocardiogram, or alternative imaging as indicated.      Relevant Medications   lisinopril-hydrochlorothiazide (ZESTORETIC) 20-12.5 MG tablet   amLODipine (NORVASC) 10 MG tablet   Other Visit Diagnoses     Palpitations    -  Primary   Relevant Orders   EKG 12-Lead (Completed)   Comp Met (CMET) (Completed)   Thyroid Panel With TSH   CBC w/Diff (Completed)   B Nat Peptide (Completed)   Chest pain, unspecified type       Relevant Orders   Comp Met (CMET) (Completed)   Thyroid Panel With TSH   CBC w/Diff (Completed)   B Nat Peptide (Completed)   Primary hypertension       Relevant Medications   lisinopril-hydrochlorothiazide (ZESTORETIC) 20-12.5 MG tablet   amLODipine (NORVASC) 10 MG tablet   Other Relevant Orders   Comp Met (CMET) (Completed)   Thyroid Panel With TSH   CBC w/Diff (Completed)   B Nat Peptide (Completed)        Medications Discontinued During This Encounter  Medication Reason   predniSONE (STERAPRED UNI-PAK 21 TAB) 10 MG (21) TBPK tablet    promethazine-dextromethorphan (PROMETHAZINE-DM) 6.25-15 MG/5ML syrup    ibuprofen (ADVIL) 600 MG tablet    indomethacin (INDOCIN) 50 MG capsule    lisinopril-hydrochlorothiazide (ZESTORETIC) 20-12.5 MG tablet Reorder      Subjective:  HPI: Encounter date: 12/08/2022  Levi Taylor is a 62 y.o. male who has Essential hypertension, benign; Idiopathic gout of right foot; Gastroesophageal reflux disease; and Hyperlipidemia on their problem list..   He  has a past medical history of Arthritis, GERD (gastroesophageal reflux disease), and Hypertension.Marland Kitchen   He presents with chief complaint of Follow up (Sob since Saturday after pulling refrigerator and he checked is b/p and the top number was 180. ) .   CHIEF COMPLAINT: Patient presents with elevated blood pressure and intermittent chest pain.  HISTORY OF PRESENT ILLNESS:  Problem 1: The patient reports experiencing significant stress and physical strain while moving heavy objects over the weekend, followed by episodes of elevated blood pressure. The patient notes a history of regular medication for blood pressure management and has observed fluctuating blood pressure readings at home, the highest being 180/100. Additionally, the patient mentions left-sided chest discomfort that has since improved and denies any current pain or breathing difficulties.  Problem 2: The patient also expresses concern regarding symptoms of anxiety, including a past history of panic during a medical exam for the [employer] at age 51, and current symptoms such as feeling shaky and sweaty during the visit.  REVIEW OF SYSTEMS: Pertinent positives: Intermittent dyspnea, left-sided chest discomfort (resolved), and transient episodes of elevated blood pressure. Pertinent negatives: No headache, no leg swelling, no blurry vision, and no  current chest pain. The remainder of the systems review is negative.     12/08/2022    2:05 PM 06/10/2022    8:26 AM 05/13/2022    8:58 AM 07/31/2013    9:11 AM  Depression screen PHQ 2/9  Decreased Interest 0 0 0 0  Down, Depressed, Hopeless 1 0 0 0  PHQ - 2 Score 1 0 0 0  Altered sleeping 1 0 1   Tired, decreased energy 1 0 1   Change in appetite 0 0 0   Feeling bad or failure about yourself  0 0 0   Trouble concentrating 0 0 0   Moving slowly or fidgety/restless 0 0 0   Suicidal thoughts 0 0 0   PHQ-9 Score 3 0 2   Difficult doing work/chores Not difficult at all Not difficult at all Not difficult at all       12/08/2022    2:05 PM 06/10/2022    8:26 AM 05/13/2022    8:58 AM  GAD 7 : Generalized Anxiety Score  Nervous, Anxious, on Edge 1 0 0  Control/stop worrying 0 0 0  Worry too much - different things 0 0 0  Trouble relaxing 1 0 0  Restless 0 0 0  Easily annoyed or irritable 0 0 0  Afraid - awful might happen 1 0 0  Total GAD 7 Score 3 0 0  Anxiety Difficulty Not difficult at all Not difficult at all Not difficult at all      Past Surgical History:  Procedure Laterality Date   FRACTURE SURGERY     HIP SURGERY      Outpatient Medications Prior to Visit  Medication Sig Dispense Refill   albuterol (VENTOLIN HFA) 108 (90 Base) MCG/ACT inhaler Inhale 2 puffs into the lungs every 6 (six) hours as needed for wheezing or shortness of breath. 8 g 0   metoprolol succinate (TOPROL-XL) 50 MG 24 hr tablet Take 1 tablet (50 mg total) by mouth daily. 90 tablet 3   Multiple Vitamin (MULTIVITAMIN) tablet Take 1 tablet by mouth daily.     omeprazole (PRILOSEC) 20 MG capsule Take 1 capsule (20 mg total) by mouth daily. 90 capsule 3   lisinopril-hydrochlorothiazide (ZESTORETIC) 20-12.5 MG tablet Take 1 tablet by mouth daily. 90 tablet 3   colchicine 0.6 MG tablet Take 1 tablet (0.6 mg total) by mouth as needed for up to 3 days. 3 tablet 0   ibuprofen (ADVIL) 600 MG tablet Take 1 tablet  (600 mg total) by mouth every 8 (eight) hours as needed. (Patient not taking: Reported on 10/19/2022) 30 tablet 0   indomethacin (INDOCIN) 50 MG capsule Take 50 mg by mouth 3 (three) times daily. (Patient not taking: Reported on 09/16/2022)     predniSONE (STERAPRED UNI-PAK 21 TAB) 10 MG (21) TBPK tablet Take as directed (Patient not taking: Reported on 12/08/2022) 21 tablet 0   promethazine-dextromethorphan (PROMETHAZINE-DM) 6.25-15 MG/5ML syrup Take 5 mLs by mouth 4 (four) times daily as needed for cough. (Patient not taking: Reported on 12/08/2022) 118 mL 0   No facility-administered medications prior to visit.    Family History  Problem Relation Age of Onset   Breast cancer Mother    Stroke Father    Hypertension Father    Cancer Neg Hx  Diabetes Neg Hx    Early death Neg Hx    Heart disease Neg Hx    Hyperlipidemia Neg Hx    Kidney disease Neg Hx    Alcohol abuse Neg Hx    Drug abuse Neg Hx    Colon cancer Neg Hx    Esophageal cancer Neg Hx    Liver cancer Neg Hx    Pancreatic cancer Neg Hx    Stomach cancer Neg Hx    Rectal cancer Neg Hx     Social History   Socioeconomic History   Marital status: Single    Spouse name: Not on file   Number of children: Not on file   Years of education: Not on file   Highest education level: Not on file  Occupational History   Not on file  Tobacco Use   Smoking status: Never   Smokeless tobacco: Never  Vaping Use   Vaping Use: Never used  Substance and Sexual Activity   Alcohol use: No   Drug use: No   Sexual activity: Not Currently  Other Topics Concern   Not on file  Social History Narrative   Not on file   Social Determinants of Health   Financial Resource Strain: Not on file  Food Insecurity: Not on file  Transportation Needs: Not on file  Physical Activity: Not on file  Stress: Not on file  Social Connections: Not on file  Intimate Partner Violence: Not on file                                                                                                  Objective:  Physical Exam: BP (!) 178/110 (BP Location: Left Arm, Patient Position: Sitting, Cuff Size: Large)   Pulse 79   Temp (!) 97.5 F (36.4 C) (Temporal)   Wt 215 lb 3.2 oz (97.6 kg)   SpO2 99%   BMI 30.88 kg/m    General: No acute distress. Awake and conversant.  Eyes: Normal conjunctiva, anicteric. Round symmetric pupils.  ENT: Hearing grossly intact. No nasal discharge.  Neck: Neck is supple. No masses or thyromegaly.  Respiratory: Respirations are non-labored. No auditory wheezing.  Skin: Warm. No rashes or ulcers.  Psych: Alert and oriented. Cooperative, Appropriate mood and affect, Normal judgment.  CV: No cyanosis or JVD MSK: Normal ambulation. No clubbing  Neuro: Sensation and CN II-XII grossly normal.   Results for orders placed or performed in visit on 12/08/22  Comp Met (CMET)  Result Value Ref Range   Sodium 139 135 - 145 mEq/L   Potassium 4.2 3.5 - 5.1 mEq/L   Chloride 103 96 - 112 mEq/L   CO2 27 19 - 32 mEq/L   Glucose, Bld 138 (H) 70 - 99 mg/dL   BUN 15 6 - 23 mg/dL   Creatinine, Ser 9.48 (H) 0.40 - 1.50 mg/dL   Total Bilirubin 0.6 0.2 - 1.2 mg/dL   Alkaline Phosphatase 67 39 - 117 U/L   AST 21 0 - 37 U/L   ALT 20 0 - 53 U/L   Total Protein 7.7 6.0 -  8.3 g/dL   Albumin 4.6 3.5 - 5.2 g/dL   GFR 50.32 (L) >60.00 mL/min   Calcium 9.4 8.4 - 10.5 mg/dL  CBC w/Diff  Result Value Ref Range   WBC 7.6 4.0 - 10.5 K/uL   RBC 4.72 4.22 - 5.81 Mil/uL   Hemoglobin 14.4 13.0 - 17.0 g/dL   HCT 41.7 39.0 - 52.0 %   MCV 88.4 78.0 - 100.0 fl   MCHC 34.4 30.0 - 36.0 g/dL   RDW 13.8 11.5 - 15.5 %   Platelets 246.0 150.0 - 400.0 K/uL   Neutrophils Relative % 64.8 43.0 - 77.0 %   Lymphocytes Relative 20.5 12.0 - 46.0 %   Monocytes Relative 9.0 3.0 - 12.0 %   Eosinophils Relative 5.3 (H) 0.0 - 5.0 %   Basophils Relative 0.4 0.0 - 3.0 %   Neutro Abs 5.0 1.4 - 7.7 K/uL   Lymphs Abs 1.6 0.7 - 4.0 K/uL   Monocytes  Absolute 0.7 0.1 - 1.0 K/uL   Eosinophils Absolute 0.4 0.0 - 0.7 K/uL   Basophils Absolute 0.0 0.0 - 0.1 K/uL  B Nat Peptide  Result Value Ref Range   Pro B Natriuretic peptide (BNP) 4.0 0.0 - 100.0 pg/mL     ECG w/ NSR RR      Alesia Banda, MD, MS

## 2022-12-08 NOTE — Patient Instructions (Signed)
Increased blood pressure occasion lisinopril/HCTZ to 2 pills a day. Continue to check blood pressure at home. We are getting labs as discussed. Please check blood pressure at home daily. Follow-up in 2 weeks for recheck blood pressure in office visit. If you develop severe chest pain shortness of breath please go to the emergency department

## 2022-12-09 LAB — THYROID PANEL WITH TSH
Free Thyroxine Index: 3.4 (ref 1.4–3.8)
T3 Uptake: 32 % (ref 22–35)
T4, Total: 10.7 ug/dL — ABNORMAL HIGH (ref 4.9–10.5)
TSH: 0.79 mIU/L (ref 0.40–4.50)

## 2022-12-15 ENCOUNTER — Ambulatory Visit: Payer: 59 | Admitting: Family Medicine

## 2022-12-20 ENCOUNTER — Encounter: Payer: Self-pay | Admitting: Family Medicine

## 2022-12-20 ENCOUNTER — Ambulatory Visit (INDEPENDENT_AMBULATORY_CARE_PROVIDER_SITE_OTHER): Payer: 59 | Admitting: Family Medicine

## 2022-12-20 VITALS — BP 158/86 | HR 80 | Temp 97.8°F | Wt 206.6 lb

## 2022-12-20 DIAGNOSIS — N179 Acute kidney failure, unspecified: Secondary | ICD-10-CM

## 2022-12-20 DIAGNOSIS — I10 Essential (primary) hypertension: Secondary | ICD-10-CM

## 2022-12-20 NOTE — Patient Instructions (Addendum)
For blood pressure, continue taking the amlodipine and metoprolol as is.  We are really ordering repeat blood work.  Continue to check blood pressure at home.  We are attempting to order a 24-hour blood pressure monitor which she will wear at home.  Will see if insurance will approve this.  We will follow-up pending any results.
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
## 2022-12-21 LAB — URINALYSIS, ROUTINE W REFLEX MICROSCOPIC
Bilirubin Urine: NEGATIVE
Hgb urine dipstick: NEGATIVE
Ketones, ur: 15 — AB
Leukocytes,Ua: NEGATIVE
Nitrite: NEGATIVE
RBC / HPF: NONE SEEN (ref 0–?)
Specific Gravity, Urine: 1.01 (ref 1.000–1.030)
Total Protein, Urine: NEGATIVE
Urine Glucose: NEGATIVE
Urobilinogen, UA: 0.2 (ref 0.0–1.0)
WBC, UA: NONE SEEN (ref 0–?)
pH: 6 (ref 5.0–8.0)

## 2022-12-21 LAB — COMPREHENSIVE METABOLIC PANEL
ALT: 19 U/L (ref 0–53)
AST: 24 U/L (ref 0–37)
Albumin: 4.7 g/dL (ref 3.5–5.2)
Alkaline Phosphatase: 61 U/L (ref 39–117)
BUN: 8 mg/dL (ref 6–23)
CO2: 27 mEq/L (ref 19–32)
Calcium: 9.8 mg/dL (ref 8.4–10.5)
Chloride: 101 mEq/L (ref 96–112)
Creatinine, Ser: 0.89 mg/dL (ref 0.40–1.50)
GFR: 93.92 mL/min (ref >60.00)
Glucose, Bld: 96 mg/dL (ref 70–99)
Potassium: 3.9 mEq/L (ref 3.5–5.1)
Sodium: 138 mEq/L (ref 135–145)
Total Bilirubin: 0.8 mg/dL (ref 0.2–1.2)
Total Protein: 7.8 g/dL (ref 6.0–8.3)

## 2022-12-21 LAB — LIPID PANEL
Cholesterol: 156 mg/dL (ref 0–200)
HDL: 42.2 mg/dL (ref >39.00)
LDL Cholesterol: 100 mg/dL — ABNORMAL HIGH (ref 0–99)
NonHDL: 114.09
Total CHOL/HDL Ratio: 4
Triglycerides: 72 mg/dL (ref 0.0–149.0)
VLDL: 14.4 mg/dL (ref 0.0–40.0)

## 2022-12-21 LAB — MICROALBUMIN / CREATININE URINE RATIO
Creatinine,U: 64.3 mg/dL
Microalb Creat Ratio: 1.1 mg/g (ref 0.0–30.0)
Microalb, Ur: <0.7 mg/dL (ref 0.0–1.9)

## 2022-12-21 LAB — HEMOGLOBIN A1C: Hgb A1c MFr Bld: 5.2 % (ref 4.6–6.5)

## 2022-12-23 ENCOUNTER — Ambulatory Visit: Payer: 59 | Admitting: Family Medicine

## 2022-12-23 LAB — URINE CULTURE
MICRO NUMBER:: 14564587
Result:: NO GROWTH
SPECIMEN QUALITY:: ADEQUATE

## 2022-12-23 LAB — PSA, TOTAL AND FREE
PSA, % Free: 14 % (calc) — ABNORMAL LOW (ref >25)
PSA, Free: 0.5 ng/mL
PSA, Total: 3.5 ng/mL (ref <=4.0)

## 2022-12-25 NOTE — Assessment & Plan Note (Signed)
Plan: Reinforce the current regimen of amlodipine and metoprolol, and monitor blood pressure home readings to assess true control. Repeat kidney function tests and urinalysis to investigate the cause of altered kidney function and darker urine, including potential post-renal causes such as an enlarged prostate. Encourage the continuation of dietary management with intermittent fasting and healthy food choices. Address peeing frequency and consult urology if symptoms of BPH (benign prostatic hyperplasia) or other problems are suspected.

## 2023-01-17 ENCOUNTER — Ambulatory Visit: Payer: 59 | Admitting: Family Medicine

## 2023-01-19 ENCOUNTER — Telehealth: Payer: Self-pay | Admitting: *Deleted

## 2023-01-19 NOTE — Telephone Encounter (Signed)
LMVM please call Darrick Penna or Valetta Fuller at [phone] in monitor department to schedule an appointment for your 24 hour ambulatory blood pressure monitor.

## 2023-01-23 ENCOUNTER — Encounter: Payer: Self-pay | Admitting: Family Medicine

## 2023-01-23 ENCOUNTER — Ambulatory Visit (INDEPENDENT_AMBULATORY_CARE_PROVIDER_SITE_OTHER): Payer: 59 | Admitting: Family Medicine

## 2023-01-23 VITALS — BP 168/88 | HR 77 | Temp 97.5°F | Wt 201.6 lb

## 2023-01-23 DIAGNOSIS — R0609 Other forms of dyspnea: Secondary | ICD-10-CM

## 2023-01-23 DIAGNOSIS — R079 Chest pain, unspecified: Secondary | ICD-10-CM | POA: Diagnosis not present

## 2023-01-23 DIAGNOSIS — I10 Essential (primary) hypertension: Secondary | ICD-10-CM | POA: Diagnosis not present

## 2023-01-23 MED ORDER — OLMESARTAN-AMLODIPINE-HCTZ 40-10-25 MG PO TABS: 1.0000 | ORAL_TABLET | Freq: Every day | ORAL | 0 refills | Status: DC

## 2023-01-23 NOTE — Progress Notes (Signed)
Assessment/Plan:   Problem List Items Addressed This Visit       Cardiovascular and Mediastinum   Essential hypertension, benign    Start Olmesartan-amLODIPine-HCTZ. Monitor blood pressure closely.  Patient follow-up with 24-hour blood pressure monitoring for accurate assessment. Recheck kidney function in two weeks.      Relevant Medications   Olmesartan-amLODIPine-HCTZ 40-10-25 MG TABS   Other Relevant Orders   Basic Metabolic Panel (BMET)     Other   Chest pain - Primary   Relevant Orders   EKG 12-Lead (Completed)   Ambulatory referral to Cardiology   DOE (dyspnea on exertion)    Differential diagnosis: Heart Failure, Exercise-induced asthma, GERD, seasonal allergies. Plan: Continue use of albuterol as needed. Referral to cardiology. Perform an echocardiogram to assess for cardiac causes of dyspnea. Consider referral to pulmonology for further evaluation if no improvement.       Relevant Orders   ECHOCARDIOGRAM COMPLETE   Ambulatory referral to Cardiology    Medications Discontinued During This Encounter  Medication Reason   amLODipine (NORVASC) 10 MG tablet       Subjective:  HPI: Encounter date: 01/23/2023  Jadarian Boothe is a 62 y.o. male who has Essential hypertension, benign; Idiopathic gout of right foot; Gastroesophageal reflux disease; Hyperlipidemia; Chest pain; and DOE (dyspnea on exertion) on their problem list..  He  has a past medical history of Arthritis, GERD (gastroesophageal reflux disease), and Hypertension..   Chief Complaint: Medical management of chronic issues including essential hypertension; recently experienced shortness of breath and pain with walking 30-40 steps.  HISTORY OF PRESENT ILLNESS:  Shortness of Breath and Leg Edema: Patient reports instances of shortness of breath, and chest tightness, particularly last week, which occurred after walking 30-40 steps. Additionally, he has noted intermittent leg swelling, more pronounced in the past month since medication adjustments. He was unclear if the shortness of breath could be related to his known condition of GERD or possibly exertion related. He also wonders if it could be related to seasonal allergens. He has used albuterol sporadically which seems to provide relief. He denies any chest pain at today's office visit but reports a sensation of chest tightness. The patient is currently on metoprolol 50 mg/day for hypertension and albuterol as needed.  ROS:  Cardiovascular: Reports shortness of breath on exertion and intermittent leg swelling. Pulmonary: Denies persistent cough or wheezing. Shortness of breath noted. Albuterol use provides relief. Remainder of ROS negative  Past Surgical History:  Procedure Laterality Date   FRACTURE SURGERY     HIP SURGERY      Outpatient Medications Prior to Visit  Medication Sig Dispense Refill   albuterol (VENTOLIN HFA) 108 (90 Base) MCG/ACT inhaler Inhale 2 puffs into the lungs every 6 (six) hours as needed for wheezing or shortness of breath. 8 g 0   metoprolol succinate (TOPROL-XL) 50 MG 24 hr tablet Take 1 tablet (50 mg total) by mouth daily. 90 tablet 3   Multiple Vitamin (MULTIVITAMIN) tablet Take 1 tablet by mouth daily.     omeprazole (PRILOSEC) 20 MG capsule Take 1 capsule (20 mg total) by mouth daily. 90 capsule 3    amLODipine (NORVASC) 10 MG tablet Take 1 tablet (10 mg total) by mouth daily. 90 tablet 0   colchicine 0.6 MG tablet Take 1 tablet (0.6 mg total) by mouth as needed for up to 3 days. 3 tablet 0   No facility-administered medications prior to visit.    Family History  Problem Relation Age of Onset   Breast cancer Mother    Stroke Father    Hypertension Father    Cancer Neg Hx    Diabetes Neg Hx    Early death Neg Hx    Heart disease Neg Hx    Hyperlipidemia Neg Hx    Kidney disease Neg Hx    Alcohol abuse Neg Hx    Drug abuse Neg Hx    Colon cancer Neg Hx    Esophageal cancer Neg Hx    Liver cancer Neg Hx    Pancreatic cancer Neg Hx    Stomach cancer Neg Hx    Rectal cancer Neg Hx     Social History   Socioeconomic History   Marital status: Single    Spouse name: Not on file   Number of children: Not on file   Years of education: Not on file   Highest education level: Not on file  Occupational History   Not on file  Tobacco Use   Smoking status: Never   Smokeless tobacco: Never  Vaping Use   Vaping Use: Never used  Substance and Sexual Activity   Alcohol use: No   Drug use: No   Sexual activity: Not Currently  Other Topics Concern   Not on file  Social History Narrative   Not on file   Social  Determinants of Health   Financial Resource Strain: Not on file  Food Insecurity: Not on file  Transportation Needs: Not on file  Physical Activity: Not on file  Stress: Not on file  Social Connections: Not on file  Intimate Partner Violence: Not on file                                                                                                 Objective:  Physical Exam: BP (!) 168/88 (BP Location: Left Arm, Patient Position: Sitting, Cuff Size: Large)   Pulse 77   Temp (!) 97.5 F (36.4 C) (Temporal)   Wt 201 lb 9.6 oz (91.4 kg)   SpO2 98%   BMI 28.93 kg/m     Physical Exam Constitutional:      Appearance: Normal appearance.  HENT:     Head:  Normocephalic and atraumatic.     Right Ear: Hearing normal.     Left Ear: Hearing normal.     Nose: Nose normal.  Eyes:     General: No scleral icterus.       Right eye: No discharge.        Left eye: No discharge.     Extraocular Movements: Extraocular movements intact.  Cardiovascular:     Rate and Rhythm: Normal rate and regular rhythm.     Heart sounds: Normal heart sounds.  Pulmonary:     Effort: Pulmonary effort is normal.     Breath sounds: Normal breath sounds.  Abdominal:     Palpations: Abdomen is soft.     Tenderness: There is no abdominal tenderness.  Musculoskeletal:     Right lower leg: 1+ Pitting Edema present.     Left lower leg: 1+ Pitting Edema present.  Skin:    General: Skin is warm.     Findings: No rash.  Neurological:     General: No focal deficit present.     Mental Status: He is alert.     Cranial Nerves: No cranial nerve deficit.  Psychiatric:        Mood and Affect: Mood normal.        Behavior: Behavior normal.        Thought Content: Thought content normal.        Judgment: Judgment normal.    ECG with normal sinus rhythm  Recent Results (from the past 2160 hour(s))  Comp Met (CMET)     Status: Abnormal   Collection Time: 12/08/22  1:58 PM  Result Value Ref Range   Sodium 139 135 - 145 mEq/L   Potassium 4.2 3.5 - 5.1 mEq/L   Chloride 103 96 - 112 mEq/L   CO2 27 19 - 32 mEq/L   Glucose, Bld 138 (H) 70 - 99 mg/dL   BUN 15 6 - 23 mg/dL   Creatinine, Ser 1.51 (H) 0.40 - 1.50 mg/dL   Total Bilirubin 0.6 0.2 - 1.2 mg/dL   Alkaline Phosphatase 67 39 - 117 U/L   AST 21 0 - 37 U/L   ALT 20 0 - 53 U/L   Total Protein 7.7 6.0 - 8.3  g/dL   Albumin 4.6 3.5 - 5.2 g/dL   GFR 50.32 (L) >60.00 mL/min    Comment: Calculated using the CKD-EPI Creatinine Equation (2021)   Calcium 9.4 8.4 - 10.5 mg/dL  Thyroid Panel With TSH     Status: Abnormal   Collection Time: 12/08/22  1:58 PM  Result Value Ref Range   T3 Uptake 32 22 - 35 %   T4, Total 10.7  (H) 4.9 - 10.5 mcg/dL   Free Thyroxine Index 3.4 1.4 - 3.8   TSH 0.79 0.40 - 4.50 mIU/L  CBC w/Diff     Status: Abnormal   Collection Time: 12/08/22  1:58 PM  Result Value Ref Range   WBC 7.6 4.0 - 10.5 K/uL   RBC 4.72 4.22 - 5.81 Mil/uL   Hemoglobin 14.4 13.0 - 17.0 g/dL   HCT 41.7 39.0 - 52.0 %   MCV 88.4 78.0 - 100.0 fl   MCHC 34.4 30.0 - 36.0 g/dL   RDW 13.8 11.5 - 15.5 %   Platelets 246.0 150.0 - 400.0 K/uL   Neutrophils Relative % 64.8 43.0 - 77.0 %   Lymphocytes Relative 20.5 12.0 - 46.0 %   Monocytes Relative 9.0 3.0 - 12.0 %   Eosinophils Relative 5.3 (H) 0.0 - 5.0 %   Basophils Relative 0.4 0.0 - 3.0 %   Neutro Abs 5.0 1.4 - 7.7 K/uL   Lymphs Abs 1.6 0.7 - 4.0 K/uL   Monocytes Absolute 0.7 0.1 - 1.0 K/uL   Eosinophils Absolute 0.4 0.0 - 0.7 K/uL   Basophils Absolute 0.0 0.0 - 0.1 K/uL  B Nat Peptide     Status: None   Collection Time: 12/08/22  1:58 PM  Result Value Ref Range   Pro B Natriuretic peptide (BNP) 4.0 0.0 - 100.0 pg/mL  Lipid panel     Status: Abnormal   Collection Time: 12/20/22  4:14 PM  Result Value Ref Range   Cholesterol 156 0 - 200 mg/dL    Comment: ATP III Classification       Desirable:  < 200 mg/dL               Borderline High:  200 - 239 mg/dL          High:  > = 240 mg/dL   Triglycerides 72.0 0.0 - 149.0 mg/dL    Comment: Normal:  <150 mg/dLBorderline High:  150 - 199 mg/dL   HDL 42.20 >39.00 mg/dL   VLDL 14.4 0.0 - 40.0 mg/dL   LDL Cholesterol 100 (H) 0 - 99 mg/dL   Total CHOL/HDL Ratio 4     Comment:                Men          Women1/2 Average Risk     3.4          3.3Average Risk          5.0          4.42X Average Risk          9.6          7.13X Average Risk          15.0          11.0                       NonHDL 114.09     Comment: NOTE:  Non-HDL goal should be 30 mg/dL higher than patient's LDL goal (  i.e. LDL goal of < 70 mg/dL, would have non-HDL goal of < 100 mg/dL)  Hemoglobin A1c     Status: None   Collection Time: 12/20/22   4:14 PM  Result Value Ref Range   Hgb A1c MFr Bld 5.2 4.6 - 6.5 %    Comment: Glycemic Control Guidelines for People with Diabetes:Non Diabetic:  <6%Goal of Therapy: <7%Additional Action Suggested:  >8%   Microalbumin / creatinine urine ratio     Status: None   Collection Time: 12/20/22  4:14 PM  Result Value Ref Range   Microalb, Ur <0.7 0.0 - 1.9 mg/dL   Creatinine,U 64.3 mg/dL   Microalb Creat Ratio 1.1 0.0 - 30.0 mg/g  Comprehensive metabolic panel     Status: None   Collection Time: 12/20/22  4:14 PM  Result Value Ref Range   Sodium 138 135 - 145 mEq/L   Potassium 3.9 3.5 - 5.1 mEq/L   Chloride 101 96 - 112 mEq/L   CO2 27 19 - 32 mEq/L   Glucose, Bld 96 70 - 99 mg/dL   BUN 8 6 - 23 mg/dL   Creatinine, Ser 0.89 0.40 - 1.50 mg/dL   Total Bilirubin 0.8 0.2 - 1.2 mg/dL   Alkaline Phosphatase 61 39 - 117 U/L   AST 24 0 - 37 U/L   ALT 19 0 - 53 U/L   Total Protein 7.8 6.0 - 8.3 g/dL   Albumin 4.7 3.5 - 5.2 g/dL   GFR 93.92 >60.00 mL/min    Comment: Calculated using the CKD-EPI Creatinine Equation (2021)   Calcium 9.8 8.4 - 10.5 mg/dL  PSA, total and free     Status: Abnormal   Collection Time: 12/20/22  4:14 PM  Result Value Ref Range   PSA, Total 3.5 < OR = 4.0 ng/mL   PSA, Free 0.5 ng/mL   PSA, % Free 14 (L) >25 % (calc)    Comment: . PSA(ng/mL)      Free PSA(%)     Estimated(x) Probability                                      of Cancer(as%) 0-2.5              (*)               Approx. 1 2.6-4.0(1)         0-27(2)                   24(3) 4.1-10(4)          0-10                      56                    11-15                     28                    16-20                     20                    21-25  16                    >or =26                   8 >10(+)             N/A                      >50 . References:(1)Catalona et al.:Urology 60: [id number] (2002)            (2)Catalona et al.:J.Urol 168: 922-925 (2002)               Free PSA(%)    Sensitivity(%)  Specificity(%)               < or = 25          85              19               < or = 30          93               9            (3)Catalona et al.:JAMA 277: [id number] (1997)            (4)Catalona et al.:JAMA 279: [id number] (1998) . (x)These estimates vary with age, ethnicity, family     history and DRE results. (*)The  diagnostic usefulness of % Free PSA has not been    established in patients with total PSA below 2.6 ng/mL (+)In men with PSA above 10 ng/mL, prostate cancer risk is    determined by total PSA alone. . The Total PSA value from this assay system is  standardized against the equimolar PSA standard.  The test result will be approximately 20% higher  when compared to the [facility] Total PSA  (Siemens assay). Comparison of serial PSA results  should be interpreted with this fact in mind. Marland Kitchen PSA was performed using the Beckman Coulter Immunoassay method. Values obtained from different assay methods cannot be used interchangeably. PSA levels, regardless of value, should not be interpreted as absolute evidence of the presence or absence of disease. Marland Kitchen   Urine Culture     Status: None   Collection Time: 12/20/22  4:14 PM   Specimen: Blood  Result Value Ref Range   MICRO NUMBER: [id number]    SPECIMEN QUALITY: Adequate    Sample Source URINE    STATUS: FINAL    Result: No Growth   Urinalysis, Routine w reflex microscopic     Status: Abnormal   Collection Time: 12/20/22  4:14 PM  Result Value Ref Range   Color, Urine YELLOW Yellow;Lt. Yellow;Straw;Dark Yellow;Amber;Green;Red;Brown   APPearance CLEAR Clear;Turbid;Slightly Cloudy;Cloudy   Specific Gravity, Urine 1.010 1.000 - 1.030   pH 6.0 5.0 - 8.0   Total Protein, Urine NEGATIVE Negative   Urine Glucose NEGATIVE Negative   Ketones, ur 15 (A) Negative   Bilirubin Urine NEGATIVE Negative   Hgb urine dipstick NEGATIVE Negative   Urobilinogen, UA 0.2 0.0 - 1.0   Leukocytes,Ua NEGATIVE Negative    Nitrite NEGATIVE Negative   WBC, UA none seen 0-2/hpf   RBC / HPF none seen 0-2/hpf          Alesia Banda, MD, MS

## 2023-01-23 NOTE — Patient Instructions (Addendum)
Please follow-up with ambulatory blood pressure monitoring.  Please come back in 2 weeks to have your kidney function checked.  For chest pain and shortness of breath, we are ordering an echocardiogram and referring to cardiology.  The office will help coordinate these appointments.  Please go to emergency department if you get severe chest pain shortness of breath   please call Darrick Penna or Valetta Fuller at [phone] in monitor department to schedule an appointment for your 24 hour ambulatory blood pressure monitor

## 2023-01-23 NOTE — Assessment & Plan Note (Signed)
Start Olmesartan-amLODIPine-HCTZ. Monitor blood pressure closely.  Patient follow-up with 24-hour blood pressure monitoring for accurate assessment. Recheck kidney function in two weeks.

## 2023-01-23 NOTE — Assessment & Plan Note (Addendum)
Differential diagnosis: Heart Failure, Exercise-induced asthma, GERD, seasonal allergies. Plan: Continue use of albuterol as needed. Referral to cardiology. Perform an echocardiogram to assess for cardiac causes of dyspnea. Consider referral to pulmonology for further evaluation if no improvement.

## 2023-01-24 ENCOUNTER — Ambulatory Visit (HOSPITAL_COMMUNITY): Payer: 59 | Attending: Internal Medicine

## 2023-01-24 DIAGNOSIS — R0609 Other forms of dyspnea: Secondary | ICD-10-CM | POA: Insufficient documentation

## 2023-01-24 LAB — ECHOCARDIOGRAM COMPLETE
Area-P 1/2: 3.03 cm2
S' Lateral: 3.4 cm

## 2023-02-07 ENCOUNTER — Other Ambulatory Visit: Payer: Self-pay | Admitting: Family Medicine

## 2023-02-07 ENCOUNTER — Ambulatory Visit: Payer: 59 | Admitting: Family Medicine

## 2023-02-07 ENCOUNTER — Encounter: Payer: Self-pay | Admitting: Family Medicine

## 2023-02-07 DIAGNOSIS — I10 Essential (primary) hypertension: Secondary | ICD-10-CM

## 2023-02-07 MED ORDER — OLMESARTAN-AMLODIPINE-HCTZ 40-10-25 MG PO TABS: 1.0000 | ORAL_TABLET | Freq: Every day | ORAL | 0 refills | Status: DC

## 2023-02-15 ENCOUNTER — Other Ambulatory Visit: Payer: Self-pay | Admitting: Family Medicine

## 2023-02-15 DIAGNOSIS — I10 Essential (primary) hypertension: Secondary | ICD-10-CM

## 2023-02-27 ENCOUNTER — Ambulatory Visit: Payer: 59 | Admitting: Cardiology

## 2023-03-06 ENCOUNTER — Ambulatory Visit: Payer: 59 | Admitting: Family Medicine

## 2023-03-07 ENCOUNTER — Other Ambulatory Visit (INDEPENDENT_AMBULATORY_CARE_PROVIDER_SITE_OTHER): Payer: 59

## 2023-03-07 ENCOUNTER — Ambulatory Visit: Payer: 59 | Admitting: Family Medicine

## 2023-03-07 DIAGNOSIS — I10 Essential (primary) hypertension: Secondary | ICD-10-CM | POA: Diagnosis not present

## 2023-03-07 LAB — BASIC METABOLIC PANEL
BUN: 16 mg/dL (ref 6–23)
CO2: 27 mEq/L (ref 19–32)
Calcium: 9.8 mg/dL (ref 8.4–10.5)
Chloride: 101 mEq/L (ref 96–112)
Creatinine, Ser: 0.99 mg/dL (ref 0.40–1.50)
GFR: 83.37 mL/min (ref >60.00)
Glucose, Bld: 88 mg/dL (ref 70–99)
Potassium: 4 mEq/L (ref 3.5–5.1)
Sodium: 139 mEq/L (ref 135–145)

## 2023-03-13 ENCOUNTER — Ambulatory Visit: Payer: 59 | Admitting: Family Medicine

## 2023-03-16 DIAGNOSIS — K219 Gastro-esophageal reflux disease without esophagitis: Secondary | ICD-10-CM | POA: Insufficient documentation

## 2023-03-17 ENCOUNTER — Ambulatory Visit: Payer: 59 | Attending: Cardiology | Admitting: Cardiovascular Disease

## 2023-03-17 ENCOUNTER — Encounter: Payer: Self-pay | Admitting: Cardiovascular Disease

## 2023-03-17 VITALS — BP 148/70 | HR 84 | Ht 70.0 in | Wt 202.4 lb

## 2023-03-17 DIAGNOSIS — R079 Chest pain, unspecified: Secondary | ICD-10-CM

## 2023-03-17 NOTE — Patient Instructions (Signed)
Medication Instructions:  No changes *If you need a refill on your cardiac medications before your next appointment, please call your pharmacy*   Lab Work: none If you have labs (blood work) drawn today and your tests are completely normal, you will receive your results only by: MyChart Message (if you have MyChart) OR A paper copy in the mail If you have any lab test that is abnormal or we need to change your treatment, we will call you to review the results.   Testing/Procedures: none   Follow-Up: As needed 

## 2023-03-17 NOTE — Progress Notes (Signed)
Chief Complaint  Patient presents with   New Patient (Initial Visit)    Chest pain   History of Present Illness: 62 yo male with history of arthritis, HTN and GERD who is here today as a new consult, referred by Dr. Janee Morn, for the evaluation of chest pain and dyspnea on exertion. Normal coronary CTA in 2013 with calcium score of zero and no evidence of CAD. Echo March 2024 with LVEF=60-65%. No valvular disease. He tells me today that he had pain in his chest 2 months ago. This lasted for 10 minutes. No recurrence since then. No associated dyspnea or diaphoresis. He is very active.   Primary Care Physician: Garnette Gunner, MD   Past Medical History:  Diagnosis Date   Arthritis    Chest pain    DOE (dyspnea on exertion)    Essential hypertension, benign 11/15/2013   GERD (gastroesophageal reflux disease)    Hyperlipidemia 05/13/2022    Past Surgical History:  Procedure Laterality Date   HIP SURGERY     Fractured hip    Current Outpatient Medications  Medication Sig Dispense Refill   albuterol (VENTOLIN HFA) 108 (90 Base) MCG/ACT inhaler Inhale 2 puffs into the lungs every 6 (six) hours as needed for wheezing or shortness of breath. 8 g 0   Multiple Vitamin (MULTIVITAMIN) tablet Take 1 tablet by mouth daily.     Olmesartan-amLODIPine-HCTZ 40-10-25 MG TABS TAKE 1 TABLET BY MOUTH DAILY 30 tablet 0   omeprazole (PRILOSEC) 20 MG capsule Take 1 capsule (20 mg total) by mouth daily. 90 capsule 3   No current facility-administered medications for this visit.    Allergies  Allergen Reactions   Pantoprazole Other (See Comments)    tremors    Social History   Socioeconomic History   Marital status: Single    Spouse name: Not on file   Number of children: 0   Years of education: Not on file   Highest education level: Not on file  Occupational History   Occupation: Works for [employer]  Tobacco Use   Smoking status: Never   Smokeless tobacco: Never   Vaping Use   Vaping Use: Never used  Substance and Sexual Activity   Alcohol use: No   Drug use: No   Sexual activity: Not Currently  Other Topics Concern   Not on file  Social History Narrative   Not on file   Social Determinants of Health   Financial Resource Strain: Not on file  Food Insecurity: Not on file  Transportation Needs: Not on file  Physical Activity: Not on file  Stress: Not on file  Social Connections: Not on file  Intimate Partner Violence: Not on file    Family History  Problem Relation Age of Onset   Breast cancer Mother    Stroke Father    Hypertension Father    Cancer Neg Hx    Diabetes Neg Hx    Early death Neg Hx    Heart disease Neg Hx    Hyperlipidemia Neg Hx    Kidney disease Neg Hx    Alcohol abuse Neg Hx    Drug abuse Neg Hx    Colon cancer Neg Hx    Esophageal cancer Neg Hx    Liver cancer Neg Hx    Pancreatic cancer Neg Hx    Stomach cancer Neg Hx    Rectal cancer Neg Hx     Review of Systems:  As stated in the HPI and  otherwise negative.   BP (!) 148/70   Pulse 84   Ht 5\' 10"  (1.778 m)   Wt 91.8 kg   SpO2 99%   BMI 29.04 kg/m   Physical Examination: General: Well developed, well nourished, NAD  HEENT: OP clear, mucus membranes moist  SKIN: warm, dry. No rashes. Neuro: No focal deficits  Musculoskeletal: Muscle strength 5/5 all ext  Psychiatric: Mood and affect normal  Neck: No JVD, no carotid bruits, no thyromegaly, no lymphadenopathy.  Lungs:Clear bilaterally, no wheezes, rhonci, crackles Cardiovascular: Regular rate and rhythm. No murmurs, gallops or rubs. Abdomen:Soft. Bowel sounds present. Non-tender.  Extremities: No lower extremity edema. Pulses are 2 + in the bilateral DP/PT.  EKG:  EKG is ordered today. The ekg ordered today demonstrates NSR  Echo 01/24/23: 1. Left ventricular ejection fraction, by estimation, is 60 to 65%. The  left ventricle has normal function. The left ventricle has no regional  wall  motion abnormalities. Left ventricular diastolic parameters were  normal.   2. Right ventricular systolic function is normal. The right ventricular  size is normal. There is normal pulmonary artery systolic pressure. The  estimated right ventricular systolic pressure is 24.0 mmHg.   3. No evidence of mitral valve regurgitation.   4. Aortic valve regurgitation is not visualized.   5. The inferior vena cava is normal in size with greater than 50%  respiratory variability, suggesting right atrial pressure of 3 mmHg.   Recent Labs: 12/08/2022: Hemoglobin 14.4; Platelets 246.0; Pro B Natriuretic peptide (BNP) 4.0; TSH 0.79 12/20/2022: ALT 19 03/07/2023: BUN 16; Creatinine, Ser 0.99; Potassium 4.0; Sodium 139   Lipid Panel    Component Value Date/Time   CHOL 156 12/20/2022 1614   TRIG 72.0 12/20/2022 1614   HDL 42.20 12/20/2022 1614   CHOLHDL 4 12/20/2022 1614   VLDL 14.4 12/20/2022 1614   LDLCALC 100 (H) 12/20/2022 1614   LDLDIRECT 142.5 11/15/2013 1451     Wt Readings from Last 3 Encounters:  03/17/23 91.8 kg  01/23/23 91.4 kg  12/20/22 93.7 kg    Assessment and Plan:   1. Chest pain: His chest pain is atypical. His EKG is normal. Echo in march 2024 with normal LV function. Chest pain is likely GI related. I do not think an ischemic workup is indicated. No further cardiac testing.   Labs/ tests ordered today include:   Orders Placed This Encounter  Procedures   EKG 12-Lead   Disposition:   F/U with me as needed  Signed, Verne Carrow, MD, [facility] 03/17/2023 4:25 PM    [facility] [address] [place], [place], [place]  [address] Phone: [phone]; Fax: [phone]

## 2023-04-26 ENCOUNTER — Other Ambulatory Visit: Payer: Self-pay | Admitting: Family Medicine

## 2023-04-26 DIAGNOSIS — I10 Essential (primary) hypertension: Secondary | ICD-10-CM

## 2023-05-08 ENCOUNTER — Encounter: Payer: Self-pay | Admitting: Family Medicine

## 2023-05-08 ENCOUNTER — Ambulatory Visit: Payer: 59 | Admitting: Family Medicine

## 2023-05-08 ENCOUNTER — Telehealth: Payer: Self-pay | Admitting: *Deleted

## 2023-05-08 VITALS — BP 120/70 | HR 82 | Temp 97.6°F | Wt 202.0 lb

## 2023-05-08 DIAGNOSIS — I10 Essential (primary) hypertension: Secondary | ICD-10-CM | POA: Diagnosis not present

## 2023-05-08 DIAGNOSIS — R002 Palpitations: Secondary | ICD-10-CM | POA: Diagnosis not present

## 2023-05-08 DIAGNOSIS — K219 Gastro-esophageal reflux disease without esophagitis: Secondary | ICD-10-CM | POA: Diagnosis not present

## 2023-05-08 DIAGNOSIS — R600 Localized edema: Secondary | ICD-10-CM | POA: Diagnosis not present

## 2023-05-08 MED ORDER — OLMESARTAN MEDOXOMIL-HCTZ 40-25 MG PO TABS: 1.0000 | ORAL_TABLET | Freq: Every day | ORAL | 3 refills | Status: DC

## 2023-05-08 MED ORDER — AMLODIPINE BESYLATE 10 MG PO TABS: 10.0000 mg | ORAL_TABLET | Freq: Every day | ORAL | 3 refills | Status: DC

## 2023-05-08 NOTE — Telephone Encounter (Signed)
  Pt is calling to follow up his 24 hour BP monitor.

## 2023-05-08 NOTE — Assessment & Plan Note (Signed)
Left-sided chest discomfort, likely related to GERD per previous cardiology assessment. Patient had stopped taking Omeprazole 20mg  daily and symptoms returned. -Resume Omeprazole 20mg  daily and monitor for improvement in symptoms.

## 2023-05-08 NOTE — Patient Instructions (Addendum)
Ambulatory blood pressure monitoring if you would like to schedule a test please call Shelly at [phone].   VISIT SUMMARY:  During your recent visit, we discussed your concerns about managing your blood pressure, palpitations, leg swelling, fatigue, and chest discomfort. We also talked about your desire to manage your medication costs more effectively.  YOUR PLAN:  -HYPERTENSION: Hypertension is high blood pressure. We noted episodes of low blood pressure when you were working outside in hot weather, likely due to dehydration and loss of electrolytes. We will continue your current medication and encourage you to stay hydrated and replenish electrolytes when working outside. We will also consider splitting your medication into its components to reduce cost.  -PALPITATIONS: Palpitations are feelings of a rapid, fluttering, or pounding heart. You've experienced increased heart rate, particularly when working outside. We advise you to monitor these symptoms and seek medical attention if they worsen or become associated with chest pain or shortness of breath.  -LOWER EXTREMITY EDEMA: Lower extremity edema is swelling in the legs, particularly in your case, the left leg. This is likely due to a previous hip injury. We will continue your current management with leg elevation.  -GASTROESOPHAGEAL REFLUX DISEASE (GERD): GERD is a condition where stomach acid frequently flows back into the tube connecting your mouth and stomach (esophagus). This can irritate the lining of your esophagus. Your left-sided chest discomfort is likely related to GERD. We advise you to resume your Omeprazole medication and monitor for improvement in symptoms.  INSTRUCTIONS:  Please continue to take your medications as prescribed. Stay hydrated and replenish electrolytes when working outside. Monitor your symptoms and seek medical attention if they worsen or become associated with chest pain or shortness of breath. Continue to  elevate your leg to manage the swelling. Resume taking Omeprazole and monitor for improvement in your chest discomfort. If you have any questions or concerns, please don't hesitate to contact us.

## 2023-05-08 NOTE — Assessment & Plan Note (Signed)
Episodes of increased heart rate (up to 113) and palpitations, particularly when working outside. Not associated chest pain or shortness of breath at the time of the events. Cardiology workup unremarkable. -Advise to monitor symptoms and seek medical attention if they worsen or become associated with chest pain or shortness of breath.

## 2023-05-08 NOTE — Assessment & Plan Note (Signed)
Chronic, bilateral, worse on the left, likely due to a previous hip injury. No recent worsening. Improves with leg elevation overnight. -Continue current management with leg elevation.

## 2023-05-08 NOTE — Progress Notes (Signed)
Assessment/Plan:   Problem List Items Addressed This Visit       Cardiovascular and Mediastinum   Essential hypertension, benign - Primary    Noted episodes of low blood pressure (80s/60s) associated with outdoor work in hot weather, likely due to dehydration and electrolyte loss. Normal blood pressure readings around 120/70 when not working outside and taking medication. -Discontinue combination pill olmesartan/Amlodipine/HCTZ 40/10/25mg  due to cost -Prescribed olmesartan/hydrochlorothiazide 40/25 mg and amlodipine 10 mg -Encourage hydration and electrolyte replenishment when working outside.       Relevant Medications   olmesartan-hydrochlorothiazide (BENICAR HCT) 40-25 MG tablet   amLODipine (NORVASC) 10 MG tablet     Digestive   Gastroesophageal reflux disease    Left-sided chest discomfort, likely related to GERD per previous cardiology assessment. Patient had stopped taking Omeprazole 20mg  daily and symptoms returned. -Resume Omeprazole 20mg  daily and monitor for improvement in symptoms.              Other   Lower extremity edema    Chronic, bilateral, worse on the left, likely due to a previous hip injury. No recent worsening. Improves with leg elevation overnight. -Continue current management with leg elevation.      Palpitations    Episodes of increased heart rate (up to 113) and palpitations, particularly when working outside. Not associated chest pain or shortness of breath at the time of the events. Cardiology workup unremarkable. -Advise to monitor symptoms and seek medical attention if they worsen or become associated with chest pain or shortness of breath.       Medications Discontinued During This Encounter  Medication Reason   Olmesartan-amLODIPine-HCTZ 40-10-25 MG TABS Reorder    Return in about 6 months (around 11/08/2023) for BP.    Subjective:   Encounter date: 05/08/2023  Levi Taylor is a 62 y.o. male who has Essential hypertension,  benign; Idiopathic gout of right foot; Gastroesophageal reflux disease; Hyperlipidemia; Chest pain; DOE (dyspnea on exertion); Lower extremity edema; and Palpitations on their problem list..   He  has a past medical history of Arthritis, Chest pain, DOE (dyspnea on exertion), Essential hypertension, benign (11/15/2013), GERD (gastroesophageal reflux disease), and Hyperlipidemia (05/13/2022).Marland Kitchen   He presents with chief complaint of Medical Management of Chronic Issues (B/p follow up. Pt's readings have been low 80's/60's . Pt didn't take b/p medication last night. His machince reads 152/87) .   Discussed the use of AI scribe software for clinical note transcription with the patient, who gave verbal consent to proceed.  History of Present Illness   The patient, with a history of hypertension, presented for a follow-up visit primarily concerning blood pressure management. They reported experiencing palpitations with heart rates reaching up to 113 bpm, which they described as a new symptom. The patient also noted episodes of unusually low blood pressure readings, such as 89/57 mmHg, which was concerning given their history of hypertension. These episodes were not associated with any specific activity and occurred at rest in the early morning.  The patient also reported experiencing leg swelling, particularly in the left leg, which they attributed to a past severe accident involving the hip. The swelling was noted to improve with elevation overnight. The patient did not report any worsening of the swelling since starting amlodipine for hypertension management.  In addition to these symptoms, the patient reported feeling fatigued and unable to perform their usual activities, such as yard work, over the past two weeks. They also experienced a sensation of shortness of breath, which they were  unsure if it was due to exertion or fatigue.  The patient also mentioned a recent episode of chest discomfort, which  they managed by applying an ice pack. They reported a history of similar episodes, which a previous consultation with a cardiologist suggested might be gastrointestinal in origin. The patient had been prescribed omeprazole for this issue but had recently stopped taking it. Upon resuming the medication, they noticed an improvement in their symptoms.  The patient expressed concern about their heart health and was eager to understand the cause of their symptoms. They also expressed a desire to manage their medication costs more effectively and were considering changing their pharmacy for this purpose.       Review of Systems  Constitutional:  Positive for malaise/fatigue.  Respiratory:  Positive for shortness of breath.   Cardiovascular:  Positive for chest pain, palpitations and leg swelling.  Gastrointestinal:  Positive for heartburn.    Past Surgical History:  Procedure Laterality Date   HIP SURGERY     Fractured hip    Outpatient Medications Prior to Visit  Medication Sig Dispense Refill   albuterol (VENTOLIN HFA) 108 (90 Base) MCG/ACT inhaler Inhale 2 puffs into the lungs every 6 (six) hours as needed for wheezing or shortness of breath. 8 g 0   Multiple Vitamin (MULTIVITAMIN) tablet Take 1 tablet by mouth daily.     omeprazole (PRILOSEC) 20 MG capsule Take 1 capsule (20 mg total) by mouth daily. 90 capsule 3   Olmesartan-amLODIPine-HCTZ 40-10-25 MG TABS TAKE 1 TABLET BY MOUTH DAILY 90 tablet 0   No facility-administered medications prior to visit.    Family History  Problem Relation Age of Onset   Breast cancer Mother    Stroke Father    Hypertension Father    Cancer Neg Hx    Diabetes Neg Hx    Early death Neg Hx    Heart disease Neg Hx    Hyperlipidemia Neg Hx    Kidney disease Neg Hx    Alcohol abuse Neg Hx    Drug abuse Neg Hx    Colon cancer Neg Hx    Esophageal cancer Neg Hx    Liver cancer Neg Hx    Pancreatic cancer Neg Hx    Stomach cancer Neg Hx    Rectal  cancer Neg Hx     Social History   Socioeconomic History   Marital status: Single    Spouse name: Not on file   Number of children: 0   Years of education: Not on file   Highest education level: Not on file  Occupational History   Occupation: Works for [employer]  Tobacco Use   Smoking status: Never   Smokeless tobacco: Never  Vaping Use   Vaping Use: Never used  Substance and Sexual Activity   Alcohol use: No   Drug use: No   Sexual activity: Not Currently  Other Topics Concern   Not on file  Social History Narrative   Not on file   Social Determinants of Health   Financial Resource Strain: Not on file  Food Insecurity: Not on file  Transportation Needs: Not on file  Physical Activity: Not on file  Stress: Not on file  Social Connections: Not on file  Intimate Partner Violence: Not on file  Objective:  Physical Exam: BP 120/70 Comment: home  Pulse 82   Temp 97.6 F (36.4 C) (Temporal)   Wt 202 lb (91.6 kg)   SpO2 100%   BMI 28.98 kg/m   Wt Readings from Last 3 Encounters:  05/08/23 202 lb (91.6 kg)  03/17/23 202 lb 6.4 oz (91.8 kg)  01/23/23 201 lb 9.6 oz (91.4 kg)     Physical Exam Constitutional:      Appearance: Normal appearance.  HENT:     Head: Normocephalic and atraumatic.     Right Ear: Hearing normal.     Left Ear: Hearing normal.     Nose: Nose normal.  Eyes:     General: No scleral icterus.       Right eye: No discharge.        Left eye: No discharge.     Extraocular Movements: Extraocular movements intact.  Cardiovascular:     Rate and Rhythm: Normal rate and regular rhythm.     Heart sounds: Normal heart sounds.  Pulmonary:     Effort: Pulmonary effort is normal.     Breath sounds: Normal breath sounds.  Abdominal:     Palpations: Abdomen is soft.     Tenderness: There is no abdominal tenderness.   Musculoskeletal:     Right lower leg: 1+ Pitting Edema present.     Left lower leg: 1+ Pitting Edema present.  Skin:    General: Skin is warm.     Findings: No rash.  Neurological:     General: No focal deficit present.     Mental Status: He is alert.     Cranial Nerves: No cranial nerve deficit.  Psychiatric:        Mood and Affect: Mood normal.        Behavior: Behavior normal.        Thought Content: Thought content normal.        Judgment: Judgment normal.     No results found.  Recent Results (from the past 2160 hour(s))  Basic Metabolic Panel (BMET)     Status: None   Collection Time: 03/07/23  1:10 PM  Result Value Ref Range   Sodium 139 135 - 145 mEq/L   Potassium 4.0 3.5 - 5.1 mEq/L   Chloride 101 96 - 112 mEq/L   CO2 27 19 - 32 mEq/L   Glucose, Bld 88 70 - 99 mg/dL   BUN 16 6 - 23 mg/dL   Creatinine, Ser 1.61 0.40 - 1.50 mg/dL   GFR 09.60 >45.40 mL/min    Comment: Calculated using the CKD-EPI Creatinine Equation (2021)   Calcium 9.8 8.4 - 10.5 mg/dL        Garner Nash, MD, MS

## 2023-05-08 NOTE — Assessment & Plan Note (Signed)
Noted episodes of low blood pressure (80s/60s) associated with outdoor work in hot weather, likely due to dehydration and electrolyte loss. Normal blood pressure readings around 120/70 when not working outside and taking medication. -Discontinue combination pill olmesartan/Amlodipine/HCTZ 40/10/25mg  due to cost -Prescribed olmesartan/hydrochlorothiazide 40/25 mg and amlodipine 10 mg -Encourage hydration and electrolyte replenishment when working outside.

## 2023-05-08 NOTE — Telephone Encounter (Signed)
Returning call.  If you would like to schedule a test please call Raeli Wiens at [phone].

## 2023-05-31 ENCOUNTER — Other Ambulatory Visit: Payer: Self-pay

## 2023-05-31 DIAGNOSIS — K219 Gastro-esophageal reflux disease without esophagitis: Secondary | ICD-10-CM

## 2023-05-31 MED ORDER — OMEPRAZOLE 20 MG PO CPDR
20.0000 mg | DELAYED_RELEASE_CAPSULE | Freq: Every day | ORAL | 3 refills | Status: DC
Start: 2023-05-31 — End: 2024-01-15

## 2023-05-31 NOTE — Telephone Encounter (Signed)
Received fax from [facility] for refill on omeprazole.   Chart supports rx. Last OV: 05/08/2023

## 2023-06-13 ENCOUNTER — Encounter: Payer: 59 | Admitting: Family Medicine

## 2024-01-15 ENCOUNTER — Encounter: Payer: Self-pay | Admitting: Family Medicine

## 2024-01-15 ENCOUNTER — Ambulatory Visit (INDEPENDENT_AMBULATORY_CARE_PROVIDER_SITE_OTHER): Payer: 59 | Admitting: Family Medicine

## 2024-01-15 VITALS — BP 136/88 | HR 74 | Temp 97.6°F | Wt 211.2 lb

## 2024-01-15 DIAGNOSIS — E6609 Other obesity due to excess calories: Secondary | ICD-10-CM

## 2024-01-15 DIAGNOSIS — M1A0711 Idiopathic chronic gout, right ankle and foot, with tophus (tophi): Secondary | ICD-10-CM | POA: Diagnosis not present

## 2024-01-15 DIAGNOSIS — Z0000 Encounter for general adult medical examination without abnormal findings: Secondary | ICD-10-CM

## 2024-01-15 DIAGNOSIS — K219 Gastro-esophageal reflux disease without esophagitis: Secondary | ICD-10-CM

## 2024-01-15 DIAGNOSIS — I10 Essential (primary) hypertension: Secondary | ICD-10-CM

## 2024-01-15 DIAGNOSIS — Z125 Encounter for screening for malignant neoplasm of prostate: Secondary | ICD-10-CM | POA: Diagnosis not present

## 2024-01-15 DIAGNOSIS — Z6830 Body mass index (BMI) 30.0-30.9, adult: Secondary | ICD-10-CM

## 2024-01-15 DIAGNOSIS — E782 Mixed hyperlipidemia: Secondary | ICD-10-CM

## 2024-01-15 DIAGNOSIS — E66811 Obesity, class 1: Secondary | ICD-10-CM

## 2024-01-15 DIAGNOSIS — H524 Presbyopia: Secondary | ICD-10-CM

## 2024-01-15 MED ORDER — OLMESARTAN MEDOXOMIL-HCTZ 40-25 MG PO TABS: 1.0000 | ORAL_TABLET | Freq: Every day | ORAL | 3 refills | Status: AC

## 2024-01-15 MED ORDER — OMEPRAZOLE 20 MG PO CPDR: 20.0000 mg | DELAYED_RELEASE_CAPSULE | Freq: Every day | ORAL | 3 refills | Status: DC

## 2024-01-15 MED ORDER — AMLODIPINE BESYLATE 10 MG PO TABS: 10.0000 mg | ORAL_TABLET | Freq: Every day | ORAL | 3 refills | Status: AC

## 2024-01-15 NOTE — Patient Instructions (Signed)
 Health Maintenance, Male Adopting a healthy lifestyle and getting preventive care are important in promoting health and wellness. Ask your health care provider about: The right schedule for you to have regular tests and exams. Things you can do on your own to prevent diseases and keep yourself healthy. What should I know about diet, weight, and exercise? Eat a healthy diet  Eat a diet that includes plenty of vegetables, fruits, low-fat dairy products, and lean protein. Do not eat a lot of foods that are high in solid fats, added sugars, or sodium. Maintain a healthy weight Body mass index (BMI) is a measurement that can be used to identify possible weight problems. It estimates body fat based on height and weight. Your health care provider can help determine your BMI and help you achieve or maintain a healthy weight. Get regular exercise Get regular exercise. This is one of the most important things you can do for your health. Most adults should: Exercise for at least 150 minutes each week. The exercise should increase your heart rate and make you sweat (moderate-intensity exercise). Do strengthening exercises at least twice a week. This is in addition to the moderate-intensity exercise. Spend less time sitting. Even light physical activity can be beneficial. Watch cholesterol and blood lipids Have your blood tested for lipids and cholesterol at 61 years of age, then have this test every 5 years. You may need to have your cholesterol levels checked more often if: Your lipid or cholesterol levels are high. You are older than 61 years of age. You are at high risk for heart disease. What should I know about cancer screening? Many types of cancers can be detected early and may often be prevented. Depending on your health history and family history, you may need to have cancer screening at various ages. This may include screening for: Colorectal cancer. Prostate cancer. Skin cancer. Lung  cancer. What should I know about heart disease, diabetes, and high blood pressure? Blood pressure and heart disease High blood pressure causes heart disease and increases the risk of stroke. This is more likely to develop in people who have high blood pressure readings or are overweight. Talk with your health care provider about your target blood pressure readings. Have your blood pressure checked: Every 3-5 years if you are 89-50 years of age. Every year if you are 25 years old or older. If you are between the ages of 17 and 66 and are a current or former smoker, ask your health care provider if you should have a one-time screening for abdominal aortic aneurysm (AAA). Diabetes Have regular diabetes screenings. This checks your fasting blood sugar level. Have the screening done: Once every three years after age 46 if you are at a normal weight and have a low risk for diabetes. More often and at a younger age if you are overweight or have a high risk for diabetes. What should I know about preventing infection? Hepatitis B If you have a higher risk for hepatitis B, you should be screened for this virus. Talk with your health care provider to find out if you are at risk for hepatitis B infection. Hepatitis C Blood testing is recommended for: Everyone born from [birth year] through 1963. Anyone with known risk factors for hepatitis C. Sexually transmitted infections (STIs) You should be screened each year for STIs, including gonorrhea and chlamydia, if: You are sexually active and are younger than 61 years of age. You are older than 61 years of age and your  health care provider tells you that you are at risk for this type of infection. Your sexual activity has changed since you were last screened, and you are at increased risk for chlamydia or gonorrhea. Ask your health care provider if you are at risk. Ask your health care provider about whether you are at high risk for HIV. Your health care provider  may recommend a prescription medicine to help prevent HIV infection. If you choose to take medicine to prevent HIV, you should first get tested for HIV. You should then be tested every 3 months for as long as you are taking the medicine. Follow these instructions at home: Alcohol use Do not drink alcohol if your health care provider tells you not to drink. If you drink alcohol: Limit how much you have to 0-2 drinks a day. Know how much alcohol is in your drink. In the [place] one drink equals one 12 oz bottle of beer (355 mL), one 5 oz glass of wine (148 mL), or one 1 oz glass of hard liquor (44 mL). Lifestyle Do not use any products that contain nicotine or tobacco. These products include cigarettes, chewing tobacco, and vaping devices, such as e-cigarettes. If you need help quitting, ask your health care provider. Do not use street drugs. Do not share needles. Ask your health care provider for help if you need support or information about quitting drugs. General instructions Schedule regular health, dental, and eye exams. Stay current with your vaccines. Tell your health care provider if: You often feel depressed. You have ever been abused or do not feel safe at home. Summary Adopting a healthy lifestyle and getting preventive care are important in promoting health and wellness. Follow your health care provider's instructions about healthy diet, exercising, and getting tested or screened for diseases. Follow your health care provider's instructions on monitoring your cholesterol and blood pressure. This information is not intended to replace advice given to you by your health care provider. Make sure you discuss any questions you have with your health care provider. Document Revised: 03/15/2021 Document Reviewed: 03/15/2021 Elsevier Patient Education  2024 Elsevier Inc.     Why follow it? Research shows. Those who follow the Mediterranean diet have a reduced risk of heart disease  The  diet is associated with a reduced incidence of Parkinson's and Alzheimer's diseases People following the diet may have longer life expectancies and lower rates of chronic diseases  The Dietary Guidelines for Americans recommends the Mediterranean diet as an eating plan to promote health and prevent disease  What Is the Mediterranean Diet?  Healthy eating plan based on typical foods and recipes of Mediterranean-style cooking The diet is primarily a plant based diet; these foods should make up a majority of meals   Starches - Plant based foods should make up a majority of meals - They are an important sources of vitamins, minerals, energy, antioxidants, and fiber - Choose whole grains, foods high in fiber and minimally processed items  - Typical grain sources include wheat, oats, barley, corn, Srinivasan rice, bulgar, farro, millet, polenta, couscous  - Various types of beans include chickpeas, lentils, fava beans, black beans, white beans   Fruits  Veggies - Large quantities of antioxidant rich fruits & veggies; 6 or more servings  - Vegetables can be eaten raw or lightly drizzled with oil and cooked  - Vegetables common to the traditional Mediterranean Diet include: artichokes, arugula, beets, broccoli, brussel sprouts, cabbage, carrots, celery, collard greens, cucumbers, eggplant, kale, leeks,  lemons, lettuce, mushrooms, okra, onions, peas, peppers, potatoes, pumpkin, radishes, rutabaga, shallots, spinach, sweet potatoes, turnips, zucchini - Fruits common to the Mediterranean Diet include: apples, apricots, avocados, cherries, clementines, dates, figs, grapefruits, grapes, melons, nectarines, oranges, peaches, pears, pomegranates, strawberries, tangerines  Fats - Replace butter and margarine with healthy oils, such as olive oil, canola oil, and tahini  - Limit nuts to no more than a handful a day  - Nuts include walnuts, almonds, pecans, pistachios, pine nuts  - Limit or avoid candied, honey roasted  or heavily salted nuts - Olives are central to the [employer] - can be eaten whole or used in a variety of dishes   Meats Protein - Limiting red meat: no more than a few times a month - When eating red meat: choose lean cuts and keep the portion to the size of deck of cards - Eggs: approx. 0 to 4 times a week  - Fish and lean poultry: at least 2 a week  - Healthy protein sources include, chicken, [place], lean beef, lamb - Increase intake of seafood such as tuna, salmon, trout, mackerel, shrimp, scallops - Avoid or limit high fat processed meats such as sausage and bacon  Dairy - Include moderate amounts of low fat dairy products  - Focus on healthy dairy such as fat free yogurt, skim milk, low or reduced fat cheese - Limit dairy products higher in fat such as whole or 2% milk, cheese, ice cream  Alcohol - Moderate amounts of red wine is ok  - No more than 5 oz daily for women (all ages) and men older than age 25  - No more than 10 oz of wine daily for men younger than 37  Other - Limit sweets and other desserts  - Use herbs and spices instead of salt to flavor foods  - Herbs and spices common to the traditional Mediterranean Diet include: basil, bay leaves, chives, cloves, cumin, fennel, garlic, lavender, marjoram, mint, oregano, parsley, pepper, rosemary, sage, savory, sumac, tarragon, thyme   It's not just a diet, it's a lifestyle:  The Mediterranean diet includes lifestyle factors typical of those in the region  Foods, drinks and meals are best eaten with others and savored Daily physical activity is important for overall good health This could be strenuous exercise like running and aerobics This could also be more leisurely activities such as walking, housework, yard-work, or taking the stairs Moderation is the key; a balanced and healthy diet accommodates most foods and drinks Consider portion sizes and frequency of consumption of certain foods   Meal Ideas & Options:   Breakfast:  Whole wheat toast or whole wheat English muffins with peanut butter & hard boiled egg Steel cut oats topped with apples & cinnamon and skim milk  Fresh fruit: banana, strawberries, melon, berries, peaches  Smoothies: strawberries, bananas, greek yogurt, peanut butter Low fat greek yogurt with blueberries and granola  Egg white omelet with spinach and mushrooms Breakfast couscous: whole wheat couscous, apricots, skim milk, cranberries  Sandwiches:  Hummus and grilled vegetables (peppers, zucchini, squash) on whole wheat bread   Grilled chicken on whole wheat pita with lettuce, tomatoes, cucumbers or tzatziki  [place] salad on whole wheat bread: tuna salad made with greek yogurt, olives, red peppers, capers, green onions Garlic rosemary lamb pita: lamb sauted with garlic, rosemary, salt & pepper; add lettuce, cucumber, greek yogurt to pita - flavor with lemon juice and black pepper  Seafood:  Mediterranean grilled salmon, seasoned with garlic,  basil, parsley, lemon juice and black pepper Shrimp, lemon, and spinach whole-grain pasta salad made with low fat greek yogurt  Seared scallops with lemon orzo  Seared tuna steaks seasoned salt, pepper, coriander topped with tomato mixture of olives, tomatoes, olive oil, minced garlic, parsley, green onions and cappers  Meats:  Herbed greek chicken salad with kalamata olives, cucumber, feta  Red bell peppers stuffed with spinach, bulgur, lean ground beef (or lentils) & topped with feta   Kebabs: skewers of chicken, tomatoes, onions, zucchini, squash  [place] burgers: made with red onions, mint, dill, lemon juice, feta cheese topped with roasted red peppers Vegetarian Cucumber salad: cucumbers, artichoke hearts, celery, red onion, feta cheese, tossed in olive oil & lemon juice  Hummus and whole grain pita points with a greek salad (lettuce, tomato, feta, olives, cucumbers, red onion) Lentil soup with celery, carrots made with vegetable broth,  garlic, salt and pepper  Tabouli salad: parsley, bulgur, mint, scallions, cucumbers, tomato, radishes, lemon juice, olive oil, salt and pepper.

## 2024-01-15 NOTE — Progress Notes (Unsigned)
 Assessment  Assessment/Plan:   Problem List Items Addressed This Visit       Cardiovascular and Mediastinum   Essential hypertension, benign     Musculoskeletal and Integument   Idiopathic gout of right foot     Other   Hyperlipidemia   Other Visit Diagnoses       Routine general medical examination at a health care facility    -  Primary     Class 1 obesity due to excess calories with serious comorbidity and body mass index (BMI) of 30.0 to 30.9 in adult           There are no discontinued medications.  Patient Counseling(The following topics were reviewed and/or handout was given):  -Nutrition: Stressed importance of moderation in sodium/caffeine intake, saturated fat and cholesterol, caloric balance, sufficient intake of fresh fruits, vegetables, and fiber.  -Stressed the importance of regular exercise.   -Substance Abuse: Discussed cessation/primary prevention of tobacco, alcohol, or other drug use; driving or other dangerous activities under the influence; availability of treatment for abuse.   -Injury prevention: Discussed safety belts, safety helmets, smoke detector, smoking near bedding or upholstery.   -Sexuality: Discussed sexually transmitted diseases, partner selection, use of condoms, avoidance of unintended pregnancy and contraceptive alternatives.   -Dental health: Discussed importance of regular tooth brushing, flossing, and dental visits.  -Health maintenance and immunizations reviewed. Please refer to Health maintenance section.  Return to care in 1 year for next preventative visit.        Subjective:  Chief complaint Encounter date: 01/15/2024  Chief Complaint  Patient presents with   Annual Exam    Fasting   Levi Taylor is a 63 y.o. male who presents today for his annual comprehensive physical exam.     Lifestyle Diet: *** Exercise: ***  ROS     01/15/2024    3:03 PM 12/20/2022    3:53 PM 12/08/2022    2:05 PM 06/10/2022    8:26 AM  05/13/2022    8:58 AM  Depression screen PHQ 2/9  Decreased Interest 0 0 0 0 0  Down, Depressed, Hopeless 0 0 1 0 0  PHQ - 2 Score 0 0 1 0 0  Altered sleeping 1 1 1  0 1  Tired, decreased energy 0 1 1 0 1  Change in appetite 0 1 0 0 0  Feeling bad or failure about yourself  0 0 0 0 0  Trouble concentrating 0 0 0 0 0  Moving slowly or fidgety/restless 0 0 0 0 0  Suicidal thoughts 0 0 0 0 0  PHQ-9 Score 1 3 3  0 2  Difficult doing work/chores Not difficult at all Not difficult at all Not difficult at all Not difficult at all Not difficult at all       01/15/2024    3:03 PM 12/20/2022    3:53 PM 12/08/2022    2:05 PM 06/10/2022    8:26 AM  GAD 7 : Generalized Anxiety Score  Nervous, Anxious, on Edge 0 1 1 0  Control/stop worrying 0 0 0 0  Worry too much - different things 0 0 0 0  Trouble relaxing 0 0 1 0  Restless 0 0 0 0  Easily annoyed or irritable 0 0 0 0  Afraid - awful might happen 0 1 1 0  Total GAD 7 Score 0 2 3 0  Anxiety Difficulty Not difficult at all Not difficult at all Not difficult at all Not difficult at all  There are no preventive care reminders to display for this patient.   Dental: ***UTD Vision: ***UTD  PMH:  The following were reviewed and entered/updated in epic: Past Medical History:  Diagnosis Date   Arthritis    Chest pain    DOE (dyspnea on exertion)    Essential hypertension, benign 11/15/2013   GERD (gastroesophageal reflux disease)    Hyperlipidemia 05/13/2022    Patient Active Problem List   Diagnosis Date Noted   Lower extremity edema 05/08/2023   Palpitations 05/08/2023   Chest pain 01/23/2023   DOE (dyspnea on exertion) 01/23/2023   Idiopathic gout of right foot 05/13/2022   Gastroesophageal reflux disease 05/13/2022   Hyperlipidemia 05/13/2022   Essential hypertension, benign 11/15/2013    Past Surgical History:  Procedure Laterality Date   HIP SURGERY     Fractured hip    Family History  Problem Relation Age of Onset    Breast cancer Mother    Stroke Father    Hypertension Father    Cancer Neg Hx    Diabetes Neg Hx    Early death Neg Hx    Heart disease Neg Hx    Hyperlipidemia Neg Hx    Kidney disease Neg Hx    Alcohol abuse Neg Hx    Drug abuse Neg Hx    Colon cancer Neg Hx    Esophageal cancer Neg Hx    Liver cancer Neg Hx    Pancreatic cancer Neg Hx    Stomach cancer Neg Hx    Rectal cancer Neg Hx     Medications- reviewed and updated Outpatient Medications Prior to Visit  Medication Sig Dispense Refill   olmesartan-hydrochlorothiazide (BENICAR HCT) 40-25 MG tablet Take 1 tablet by mouth daily. 90 tablet 3   omeprazole (PRILOSEC) 20 MG capsule Take 1 capsule (20 mg total) by mouth daily. 90 capsule 3   albuterol (VENTOLIN HFA) 108 (90 Base) MCG/ACT inhaler Inhale 2 puffs into the lungs every 6 (six) hours as needed for wheezing or shortness of breath. (Patient not taking: Reported on 01/15/2024) 8 g 0   amLODipine (NORVASC) 10 MG tablet Take 1 tablet (10 mg total) by mouth daily. 90 tablet 3   Multiple Vitamin (MULTIVITAMIN) tablet Take 1 tablet by mouth daily. (Patient not taking: Reported on 01/15/2024)     No facility-administered medications prior to visit.    Allergies  Allergen Reactions   Pantoprazole Other (See Comments)    tremors    Social History   Socioeconomic History   Marital status: Married    Spouse name: Not on file   Number of children: 0   Years of education: Not on file   Highest education level: Not on file  Occupational History   Occupation: Works for [employer]  Tobacco Use   Smoking status: Never   Smokeless tobacco: Never  Vaping Use   Vaping status: Never Used  Substance and Sexual Activity   Alcohol use: No   Drug use: No   Sexual activity: Not Currently  Other Topics Concern   Not on file  Social History Narrative   Not on file   Social Drivers of Health   Financial Resource Strain: Not on file  Food Insecurity: Not on  file  Transportation Needs: Not on file  Physical Activity: Not on file  Stress: Not on file  Social Connections: Not on file           Objective:  Physical Exam: BP 136/88  Pulse 74   Temp 97.6 F (36.4 C) (Temporal)   Wt 211 lb 3.2 oz (95.8 kg)   SpO2 96%   BMI 30.30 kg/m   Body mass index is 30.3 kg/m. Wt Readings from Last 3 Encounters:  01/15/24 211 lb 3.2 oz (95.8 kg)  05/08/23 202 lb (91.6 kg)  03/17/23 202 lb 6.4 oz (91.8 kg)    Physical Exam      At today's visit, we discussed treatment options, associated risk and benefits, and engage in counseling as needed.  Additionally the following were reviewed: Past medical records, past medical and surgical history, family and social background, as well as relevant laboratory results, imaging findings, and specialty notes, where applicable.  This message was generated using dictation software, and as a result, it may contain unintentional typos or errors.  Nevertheless, extensive effort was made to accurately convey at the pertinent aspects of the patient visit.    There may have been are other unrelated non-urgent complaints, but due to the busy schedule and the amount of time already spent with him, time does not permit to address these issues at today's visit. Another appointment may have or has been requested to review these additional issues.     Thomes Dinning, MD, MS

## 2024-01-16 ENCOUNTER — Encounter: Payer: Self-pay | Admitting: Family Medicine

## 2024-01-16 ENCOUNTER — Other Ambulatory Visit: Payer: Self-pay | Admitting: Family Medicine

## 2024-01-16 DIAGNOSIS — E6609 Other obesity due to excess calories: Secondary | ICD-10-CM | POA: Insufficient documentation

## 2024-01-16 DIAGNOSIS — R972 Elevated prostate specific antigen [PSA]: Secondary | ICD-10-CM

## 2024-01-16 DIAGNOSIS — H524 Presbyopia: Secondary | ICD-10-CM | POA: Insufficient documentation

## 2024-01-16 LAB — URINALYSIS W MICROSCOPIC + REFLEX CULTURE
Bilirubin Urine: NEGATIVE
Glucose, UA: NEGATIVE
Hgb urine dipstick: NEGATIVE
Hyaline Cast: NONE SEEN /LPF
Ketones, ur: NEGATIVE
Leukocyte Esterase: NEGATIVE
Nitrites, Initial: NEGATIVE
Protein, ur: NEGATIVE
Specific Gravity, Urine: 1.019 (ref 1.001–1.035)
Squamous Epithelial / HPF: NONE SEEN /HPF (ref <=5)
pH: 7 (ref 5.0–8.0)

## 2024-01-16 LAB — COMPREHENSIVE METABOLIC PANEL
ALT: 19 U/L (ref 0–53)
AST: 20 U/L (ref 0–37)
Albumin: 4.8 g/dL (ref 3.5–5.2)
Alkaline Phosphatase: 56 U/L (ref 39–117)
BUN: 18 mg/dL (ref 6–23)
CO2: 28 meq/L (ref 19–32)
Calcium: 9.6 mg/dL (ref 8.4–10.5)
Chloride: 104 meq/L (ref 96–112)
Creatinine, Ser: 1.07 mg/dL (ref 0.40–1.50)
GFR: 75.49 mL/min (ref >60.00)
Glucose, Bld: 97 mg/dL (ref 70–99)
Potassium: 3.7 meq/L (ref 3.5–5.1)
Sodium: 141 meq/L (ref 135–145)
Total Bilirubin: 0.6 mg/dL (ref 0.2–1.2)
Total Protein: 7.8 g/dL (ref 6.0–8.3)

## 2024-01-16 LAB — MICROALBUMIN / CREATININE URINE RATIO
Creatinine,U: 150.4 mg/dL
Microalb Creat Ratio: 5.4 mg/g (ref 0.0–30.0)
Microalb, Ur: 0.8 mg/dL (ref 0.0–1.9)

## 2024-01-16 LAB — CBC WITH DIFFERENTIAL/PLATELET
Basophils Absolute: 0.1 10*3/uL (ref 0.0–0.1)
Basophils Relative: 0.9 % (ref 0.0–3.0)
Eosinophils Absolute: 0.3 10*3/uL (ref 0.0–0.7)
Eosinophils Relative: 4.8 % (ref 0.0–5.0)
HCT: 39.5 % (ref 39.0–52.0)
Hemoglobin: 13.1 g/dL (ref 13.0–17.0)
Lymphocytes Relative: 27.3 % (ref 12.0–46.0)
Lymphs Abs: 1.9 10*3/uL (ref 0.7–4.0)
MCHC: 33.1 g/dL (ref 30.0–36.0)
MCV: 89.4 fl (ref 78.0–100.0)
Monocytes Absolute: 0.5 10*3/uL (ref 0.1–1.0)
Monocytes Relative: 7.8 % (ref 3.0–12.0)
Neutro Abs: 4.1 10*3/uL (ref 1.4–7.7)
Neutrophils Relative %: 59.2 % (ref 43.0–77.0)
Platelets: 277 10*3/uL (ref 150.0–400.0)
RBC: 4.42 Mil/uL (ref 4.22–5.81)
RDW: 12.9 % (ref 11.5–15.5)
WBC: 7 10*3/uL (ref 4.0–10.5)

## 2024-01-16 LAB — NO CULTURE INDICATED

## 2024-01-16 LAB — LIPID PANEL
Cholesterol: 178 mg/dL (ref 0–200)
HDL: 43.2 mg/dL (ref >39.00)
LDL Cholesterol: 106 mg/dL — ABNORMAL HIGH (ref 0–99)
NonHDL: 134.74
Total CHOL/HDL Ratio: 4
Triglycerides: 146 mg/dL (ref 0.0–149.0)
VLDL: 29.2 mg/dL (ref 0.0–40.0)

## 2024-01-16 LAB — TSH: TSH: 0.95 u[IU]/mL (ref 0.35–5.50)

## 2024-01-16 LAB — PSA: PSA: 4.85 ng/mL — ABNORMAL HIGH (ref 0.10–4.00)

## 2024-01-16 LAB — URIC ACID: Uric Acid, Serum: 9.3 mg/dL — ABNORMAL HIGH (ref 4.0–7.8)

## 2024-01-16 LAB — HEMOGLOBIN A1C: Hgb A1c MFr Bld: 5.4 % (ref 4.6–6.5)

## 2024-02-15 ENCOUNTER — Encounter: Admitting: Urology

## 2024-02-22 ENCOUNTER — Encounter: Admitting: Urology

## 2024-02-28 ENCOUNTER — Encounter: Payer: Self-pay | Admitting: Urology

## 2024-02-28 ENCOUNTER — Ambulatory Visit: Admitting: Urology

## 2024-02-28 VITALS — BP 144/80 | HR 99 | Ht 67.0 in | Wt 202.0 lb

## 2024-02-28 DIAGNOSIS — N401 Enlarged prostate with lower urinary tract symptoms: Secondary | ICD-10-CM | POA: Diagnosis not present

## 2024-02-28 DIAGNOSIS — N138 Other obstructive and reflux uropathy: Secondary | ICD-10-CM | POA: Diagnosis not present

## 2024-02-28 DIAGNOSIS — R972 Elevated prostate specific antigen [PSA]: Secondary | ICD-10-CM | POA: Insufficient documentation

## 2024-02-28 LAB — URINALYSIS, ROUTINE W REFLEX MICROSCOPIC
Bilirubin, UA: NEGATIVE
Glucose, UA: NEGATIVE
Ketones, UA: NEGATIVE
Leukocytes,UA: NEGATIVE
Nitrite, UA: NEGATIVE
Protein,UA: NEGATIVE
RBC, UA: NEGATIVE
Specific Gravity, UA: 1.02 (ref 1.005–1.030)
Urobilinogen, Ur: 0.2 mg/dL (ref 0.2–1.0)
pH, UA: 6.5 (ref 5.0–7.5)

## 2024-02-28 MED ORDER — TAMSULOSIN HCL 0.4 MG PO CAPS: 0.4000 mg | ORAL_CAPSULE | Freq: Every day | ORAL | 11 refills | Status: DC

## 2024-02-28 NOTE — Progress Notes (Signed)
 Assessment: 1. Elevated PSA   2. BPH with obstruction/lower urinary tract symptoms    Plan: I personally reviewed the patient's chart including provider notes, and lab results. Today I had a long discussion with the patient regarding PSA and the rationale and controversies of prostate cancer early detection.  I discussed the pros and cons of further evaluation including TRUS and prostate Bx.  Potential adverse events and complications as well as standard instructions were given.  Patient expressed his understanding of these issues. I discussed the role of MRI in diagnosis and staging of prostate cancer.  I also discussed the potential benefit of a targeted biopsy using MRI data. He would like to proceed with MRI of the prostate.  Will schedule this and contact him with results when available. He would like to try medical therapy for his lower urinary tract symptoms. Prescription for tamsulosin  sent to his pharmacy.   Chief Complaint:  Chief Complaint  Patient presents with   Elevated PSA    History of Present Illness:  Levi Taylor is a 63 y.o. male who is seen in consultation from Catheryn Cluck, MD for evaluation of elevated PSA. PSA results: 3/25 4.85 2/24 3.5 with 14% free  No prior history of elevated PSA.Levi Taylor  No history of UTIs or prostatitis.  No family history of prostate cancer. No prior prostate biopsy. He has had some gradual increase in his lower urinary tract symptoms.  He reports urinary frequency, nocturia x 3, intermittent stream, and decreased force of stream.  No dysuria or gross hematuria. IPSS = 17/3.   Past Medical History:  Past Medical History:  Diagnosis Date   Arthritis    Chest pain    DOE (dyspnea on exertion)    Essential hypertension, benign 11/15/2013   GERD (gastroesophageal reflux disease)    Hyperlipidemia 05/13/2022    Past Surgical History:  Past Surgical History:  Procedure Laterality Date   HIP SURGERY     Fractured hip     Allergies:  Allergies  Allergen Reactions   Pantoprazole Other (See Comments)    tremors    Family History:  Family History  Problem Relation Age of Onset   Breast cancer Mother    Stroke Father    Hypertension Father    Cancer Neg Hx    Diabetes Neg Hx    Early death Neg Hx    Heart disease Neg Hx    Hyperlipidemia Neg Hx    Kidney disease Neg Hx    Alcohol abuse Neg Hx    Drug abuse Neg Hx    Colon cancer Neg Hx    Esophageal cancer Neg Hx    Liver cancer Neg Hx    Pancreatic cancer Neg Hx    Stomach cancer Neg Hx    Rectal cancer Neg Hx     Social History:  Social History   Tobacco Use   Smoking status: Never   Smokeless tobacco: Never  Vaping Use   Vaping status: Never Used  Substance Use Topics   Alcohol use: No   Drug use: No    Review of symptoms:  Constitutional:  Negative for unexplained weight loss, night sweats, fever, chills ENT:  Negative for nose bleeds, sinus pain, painful swallowing CV:  Negative for chest pain, shortness of breath, exercise intolerance, palpitations, loss of consciousness Resp:  Negative for cough, wheezing, shortness of breath GI:  Negative for nausea, vomiting, diarrhea, bloody stools GU:  Positives noted in HPI; otherwise negative for gross  hematuria, dysuria, urinary incontinence Neuro:  Negative for seizures, poor balance, limb weakness, slurred speech Psych:  Negative for lack of energy, depression, anxiety Endocrine:  Negative for polydipsia, polyuria, symptoms of hypoglycemia (dizziness, hunger, sweating) Hematologic:  Negative for anemia, purpura, petechia, prolonged or excessive bleeding, use of anticoagulants  Allergic:  Negative for difficulty breathing or choking as a result of exposure to anything; no shellfish allergy; no allergic response (rash/itch) to materials, foods  Physical exam: BP (!) 144/80   Pulse 99   Ht 5\' 7"  (1.702 m)   Wt 202 lb (91.6 kg)   BMI 31.64 kg/m  GENERAL APPEARANCE:  Well  appearing, well developed, well nourished, NAD HEENT: Atraumatic, Normocephalic, oropharynx clear. NECK: Supple without lymphadenopathy or thyromegaly. LUNGS: Clear to auscultation bilaterally. HEART: Regular Rate and Rhythm without murmurs, gallops, or rubs. ABDOMEN: Soft, non-tender, No Masses. EXTREMITIES: Moves all extremities well.  Without clubbing, cyanosis, or edema. NEUROLOGIC:  Alert and oriented x 3, normal gait, CN II-XII grossly intact.  MENTAL STATUS:  Appropriate. BACK:  Non-tender to palpation.  No CVAT SKIN:  Warm, dry and intact.   GU: Penis:  uncircumcised Meatus: Normal Scrotum: normal, no masses Testis: normal without masses bilateral Prostate: 40 g, NT, no nodules, some asymmetry R>L Rectum: Normal tone,  no masses or tenderness   Results: U/A: Negative

## 2024-04-06 ENCOUNTER — Ambulatory Visit
Admission: RE | Admit: 2024-04-06 | Discharge: 2024-04-06 | Disposition: A | Discharge status: Home / Self Care | Source: Ambulatory Visit | Attending: Urology | Admitting: Urology

## 2024-04-06 DIAGNOSIS — R972 Elevated prostate specific antigen [PSA]: Secondary | ICD-10-CM

## 2024-04-06 MED ORDER — GADOPICLENOL 0.5 MMOL/ML IV SOLN
9.0000 mL | Freq: Once | INTRAVENOUS | Status: AC | PRN
  Administered 2024-04-06: 9 mL via INTRAVENOUS

## 2024-04-08 ENCOUNTER — Ambulatory Visit: Payer: Self-pay | Admitting: Urology

## 2024-04-09 ENCOUNTER — Other Ambulatory Visit: Payer: Self-pay | Admitting: Urology

## 2024-04-09 DIAGNOSIS — R972 Elevated prostate specific antigen [PSA]: Secondary | ICD-10-CM

## 2024-04-09 MED ORDER — ALPRAZOLAM 1 MG PO TABS: ORAL_TABLET | ORAL | 0 refills | Status: DC

## 2024-04-09 NOTE — Telephone Encounter (Signed)
 Pt scheduled for biopsy. Pt given instructions verbally over the phone, mychart instructions sent as well.

## 2024-04-09 NOTE — Telephone Encounter (Signed)
-----   Message from Oda Bence sent at 04/09/2024  1:00 PM EDT ----- Please contact patient with instructions for prostate biopsy and schedule time for biopsy next available.  Orders entered.

## 2024-04-19 ENCOUNTER — Telehealth: Payer: Self-pay | Admitting: Urology

## 2024-04-19 NOTE — Telephone Encounter (Signed)
 Patient has had a gout flare up. Is currently taking Colchicine  and Tylenol and wants to know if he needs to reschedule his biopsy or not.

## 2024-04-19 NOTE — Telephone Encounter (Signed)
 Per Dr Willye Harvey, Patient needs to reschedule the biopsy due to the Colchicine  he is taking. If/when patient calls back please reschedule. LVM relaying information and to call office back to r/s

## 2024-04-23 ENCOUNTER — Other Ambulatory Visit: Admitting: Urology

## 2024-04-23 ENCOUNTER — Ambulatory Visit (HOSPITAL_BASED_OUTPATIENT_CLINIC_OR_DEPARTMENT_OTHER)

## 2024-05-07 ENCOUNTER — Ambulatory Visit: Admitting: Urology

## 2024-05-07 ENCOUNTER — Ambulatory Visit (HOSPITAL_BASED_OUTPATIENT_CLINIC_OR_DEPARTMENT_OTHER)
Admission: RE | Admit: 2024-05-07 | Discharge: 2024-05-07 | Disposition: A | Discharge status: Home / Self Care | Source: Ambulatory Visit | Attending: Urology | Admitting: Urology

## 2024-05-07 ENCOUNTER — Encounter: Payer: Self-pay | Admitting: Urology

## 2024-05-07 VITALS — BP 126/78 | HR 76 | Ht 67.0 in | Wt 200.0 lb

## 2024-05-07 DIAGNOSIS — R972 Elevated prostate specific antigen [PSA]: Secondary | ICD-10-CM

## 2024-05-07 DIAGNOSIS — N411 Chronic prostatitis: Secondary | ICD-10-CM | POA: Diagnosis not present

## 2024-05-07 DIAGNOSIS — N401 Enlarged prostate with lower urinary tract symptoms: Secondary | ICD-10-CM

## 2024-05-07 DIAGNOSIS — Z2989 Encounter for other specified prophylactic measures: Secondary | ICD-10-CM

## 2024-05-07 DIAGNOSIS — N4289 Other specified disorders of prostate: Secondary | ICD-10-CM | POA: Diagnosis not present

## 2024-05-07 DIAGNOSIS — N138 Other obstructive and reflux uropathy: Secondary | ICD-10-CM

## 2024-05-07 LAB — MICROSCOPIC EXAMINATION

## 2024-05-07 LAB — URINALYSIS, ROUTINE W REFLEX MICROSCOPIC
Bilirubin, UA: NEGATIVE
Glucose, UA: NEGATIVE
Ketones, UA: NEGATIVE
Leukocytes,UA: NEGATIVE
Nitrite, UA: NEGATIVE
Protein,UA: NEGATIVE
Specific Gravity, UA: 1.015 (ref 1.005–1.030)
Urobilinogen, Ur: 0.2 mg/dL (ref 0.2–1.0)
pH, UA: 7 (ref 5.0–7.5)

## 2024-05-07 MED ORDER — CEFTRIAXONE SODIUM 500 MG IJ SOLR
1.0000 g | Freq: Once | INTRAMUSCULAR | Status: AC
  Administered 2024-05-07: 1 g via INTRAMUSCULAR

## 2024-05-07 NOTE — Progress Notes (Signed)
 Assessment: 1. Elevated PSA   2. BPH with obstruction/lower urinary tract symptoms     Plan: Post-biopsy instructions given. Return to office in 7-10 days for biopsy results.  Chief Complaint:  Chief Complaint  Patient presents with   Prostate Biopsy    History of Present Illness:  Levi Taylor is a 63 y.o. male who is seen for further evaluation of elevated PSA. PSA results: 3/25 4.85 2/24 3.5 with 14% free  No prior history of elevated PSA.SABRA  No history of UTIs or prostatitis.  No family history of prostate cancer. No prior prostate biopsy. He has had some gradual increase in his lower urinary tract symptoms.  He reports urinary frequency, nocturia x 3, intermittent stream, and decreased force of stream.  No dysuria or gross hematuria. IPSS = 17/3. He was started on tamsulosin  in April 2025.  Prostate MRI from 04/06/2024 showed no radiographic evidence of high-grade prostate carcinoma; volume 44 ml.  He presents today for further evaluation with a prostate biopsy.  Portions of the above documentation were copied from a prior visit for review purposes only.   Past Medical History:  Past Medical History:  Diagnosis Date   Arthritis    Chest pain    DOE (dyspnea on exertion)    Essential hypertension, benign 11/15/2013   GERD (gastroesophageal reflux disease)    Hyperlipidemia 05/13/2022    Past Surgical History:  Past Surgical History:  Procedure Laterality Date   HIP SURGERY     Fractured hip    Allergies:  Allergies  Allergen Reactions   Pantoprazole Other (See Comments)    tremors    Family History:  Family History  Problem Relation Age of Onset   Breast cancer Mother    Stroke Father    Hypertension Father    Cancer Neg Hx    Diabetes Neg Hx    Early death Neg Hx    Heart disease Neg Hx    Hyperlipidemia Neg Hx    Kidney disease Neg Hx    Alcohol abuse Neg Hx    Drug abuse Neg Hx    Colon cancer Neg Hx    Esophageal cancer Neg Hx     Liver cancer Neg Hx    Pancreatic cancer Neg Hx    Stomach cancer Neg Hx    Rectal cancer Neg Hx     Social History:  Social History   Tobacco Use   Smoking status: Never   Smokeless tobacco: Never  Vaping Use   Vaping status: Never Used  Substance Use Topics   Alcohol use: No   Drug use: No    ROS: Constitutional:  Negative for fever, chills, weight loss CV: Negative for chest pain, previous MI, hypertension Respiratory:  Negative for shortness of breath, wheezing, sleep apnea, frequent cough GI:  Negative for nausea, vomiting, bloody stool, GERD  Physical exam: BP 126/78   Pulse 76   Ht 5' 7 (1.702 m)   Wt 200 lb (90.7 kg)   BMI 31.32 kg/m  GENERAL APPEARANCE:  Well appearing, well developed, well nourished, NAD HEENT:  Atraumatic, normocephalic, oropharynx clear NECK:  Supple without lymphadenopathy or thyromegaly ABDOMEN:  Soft, non-tender, no masses EXTREMITIES:  Moves all extremities well, without clubbing, cyanosis, or edema NEUROLOGIC:  Alert and oriented x 3, normal gait, CN II-XII grossly intact MENTAL STATUS:  appropriate BACK:  Non-tender to palpation, No CVAT SKIN:  Warm, dry, and intact   Results: U/A: 0-5 WBC, 0-2 RBC  TRANSRECTAL ULTRASOUND  AND PROSTATE BIOPSY  Indication:  Elevated PSA  Prophylactic antibiotic administration: Rocephin  All medications that could result in increased bleeding were discontinued within an appropriate period of the time of biopsy.  Risk including bleeding and infection were discussed.  Informed consent was obtained.  The patient was placed in the left lateral decubitus position.  PROCEDURE 1.  TRANSRECTAL ULTRASOUND OF THE PROSTATE  The 7 MHz transrectal probe was used to image the prostate.  Anal stenosis was not noted.  TRUS volume: 43.2 ml  Hypoechoic areas: None  Hyperechoic areas: None  Central calcifications: present  Margins:  normal  Seminal Vesicles: normal   PROCEDURE 2:  PROSTATE  BIOPSY  A periprostatic block was performed using 1% lidocaine and transrectal ultrasound guidance. Under transrectal ultrasound guidance, and using the Biopty gun, prostate biopsies were obtained systematically from the apex, mid gland, and base bilaterally.  A total of 12 cores were obtained.  Hemostasis was obtained with gentle pressure on the prostate.  The procedures were well-tolerated.  No significant bleeding was noted at the end of the procedure.  The patient was stable for discharge from the office.

## 2024-05-07 NOTE — Progress Notes (Signed)
 IM Injection  Patient is present today for an IM Injection for treatment of infection prevention post prostate biopsy Drug: Ceftriaxone Dose:1g Location: Right outter buttock Lot: [id number] Exp:01/2026 Patient tolerated well, no complications were noted  Performed by: C. Koleen, LPN

## 2024-05-14 ENCOUNTER — Encounter: Payer: Self-pay | Admitting: Urology

## 2024-05-15 ENCOUNTER — Other Ambulatory Visit: Payer: Self-pay | Admitting: Family Medicine

## 2024-05-15 DIAGNOSIS — M1A0710 Idiopathic chronic gout, right ankle and foot, without tophus (tophi): Secondary | ICD-10-CM

## 2024-05-15 MED ORDER — COLCHICINE 0.6 MG PO TABS: ORAL_TABLET | ORAL | 0 refills | Status: DC

## 2024-05-16 ENCOUNTER — Ambulatory Visit: Admitting: Urology

## 2024-06-04 ENCOUNTER — Other Ambulatory Visit: Payer: Self-pay

## 2024-06-04 DIAGNOSIS — M1A0710 Idiopathic chronic gout, right ankle and foot, without tophus (tophi): Secondary | ICD-10-CM

## 2024-06-04 MED ORDER — COLCHICINE 0.6 MG PO TABS
ORAL_TABLET | ORAL | 0 refills | Status: DC
Start: 2024-06-04 — End: 2024-06-27

## 2024-06-19 ENCOUNTER — Ambulatory Visit: Admitting: Urology

## 2024-06-27 ENCOUNTER — Other Ambulatory Visit: Payer: Self-pay | Admitting: Family Medicine

## 2024-06-27 DIAGNOSIS — M1A0710 Idiopathic chronic gout, right ankle and foot, without tophus (tophi): Secondary | ICD-10-CM

## 2024-07-09 ENCOUNTER — Ambulatory Visit: Admitting: Family Medicine

## 2024-07-09 ENCOUNTER — Encounter: Payer: Self-pay | Admitting: Family Medicine

## 2024-07-09 VITALS — BP 110/74 | HR 83 | Temp 97.1°F | Resp 18 | Wt 207.8 lb

## 2024-07-09 DIAGNOSIS — M1A0710 Idiopathic chronic gout, right ankle and foot, without tophus (tophi): Secondary | ICD-10-CM

## 2024-07-09 DIAGNOSIS — N138 Other obstructive and reflux uropathy: Secondary | ICD-10-CM

## 2024-07-09 DIAGNOSIS — I10 Essential (primary) hypertension: Secondary | ICD-10-CM | POA: Diagnosis not present

## 2024-07-09 DIAGNOSIS — M1A0711 Idiopathic chronic gout, right ankle and foot, with tophus (tophi): Secondary | ICD-10-CM | POA: Diagnosis not present

## 2024-07-09 DIAGNOSIS — Z23 Encounter for immunization: Secondary | ICD-10-CM | POA: Diagnosis not present

## 2024-07-09 DIAGNOSIS — N401 Enlarged prostate with lower urinary tract symptoms: Secondary | ICD-10-CM

## 2024-07-09 DIAGNOSIS — R972 Elevated prostate specific antigen [PSA]: Secondary | ICD-10-CM | POA: Diagnosis not present

## 2024-07-09 DIAGNOSIS — K219 Gastro-esophageal reflux disease without esophagitis: Secondary | ICD-10-CM

## 2024-07-09 DIAGNOSIS — E782 Mixed hyperlipidemia: Secondary | ICD-10-CM

## 2024-07-09 LAB — COMPREHENSIVE METABOLIC PANEL WITH GFR
ALT: 21 U/L (ref 0–53)
AST: 25 U/L (ref 0–37)
Albumin: 4.8 g/dL (ref 3.5–5.2)
Alkaline Phosphatase: 66 U/L (ref 39–117)
BUN: 15 mg/dL (ref 6–23)
CO2: 27 meq/L (ref 19–32)
Calcium: 9.8 mg/dL (ref 8.4–10.5)
Chloride: 101 meq/L (ref 96–112)
Creatinine, Ser: 1.16 mg/dL (ref 0.40–1.50)
GFR: 68.28 mL/min (ref >60.00)
Glucose, Bld: 91 mg/dL (ref 70–99)
Potassium: 3.9 meq/L (ref 3.5–5.1)
Sodium: 140 meq/L (ref 135–145)
Total Bilirubin: 0.9 mg/dL (ref 0.2–1.2)
Total Protein: 8.1 g/dL (ref 6.0–8.3)

## 2024-07-09 LAB — LIPID PANEL
Cholesterol: 172 mg/dL (ref 0–200)
HDL: 45.4 mg/dL (ref >39.00)
LDL Cholesterol: 108 mg/dL — ABNORMAL HIGH (ref 0–99)
NonHDL: 126.26
Total CHOL/HDL Ratio: 4
Triglycerides: 92 mg/dL (ref 0.0–149.0)
VLDL: 18.4 mg/dL (ref 0.0–40.0)

## 2024-07-09 LAB — PSA: PSA: 7.64 ng/mL — ABNORMAL HIGH (ref 0.10–4.00)

## 2024-07-09 LAB — URIC ACID: Uric Acid, Serum: 10.3 mg/dL — ABNORMAL HIGH (ref 4.0–7.8)

## 2024-07-09 MED ORDER — COLCHICINE 0.6 MG PO TABS: 0.6000 mg | ORAL_TABLET | Freq: Every day | ORAL | 0 refills | Status: DC

## 2024-07-09 MED ORDER — ALLOPURINOL 300 MG PO TABS: 300.0000 mg | ORAL_TABLET | Freq: Every day | ORAL | 1 refills | Status: DC

## 2024-07-09 MED ORDER — TAMSULOSIN HCL 0.4 MG PO CAPS: 0.8000 mg | ORAL_CAPSULE | Freq: Every day | ORAL | 3 refills | Status: AC

## 2024-07-09 NOTE — Progress Notes (Signed)
 Assessment & Plan   Assessment/Plan:    Assessment & Plan Essential hypertension Blood pressure is well-controlled at 110/74 mmHg with current medication regimen. - Continue amlodipine  10 mg daily - Continue olmesartan -hydrochlorothiazide  40-25 mg tablet daily - Check complete metabolic panel  Idiopathic chronic gout, right foot/heel, with tophus Recent flare 3-4 months ago with elevated uric acid level at 9.3. Pain localized to the right heel. Colchicine  effective for flares. Discussed starting allopurinol  to lower uric acid levels with the goal of reducing it to under 6. Explained the need for colchicine  prophylaxis during the initial period of allopurinol  treatment to prevent flares due to increased risk. - Start allopurinol  300 mg daily - Use colchicine  daily for the first 3-6 months of allopurinol  treatment to prevent flares - Recheck uric acid level in 3 months  Benign prostatic hyperplasia with lower urinary tract symptoms Experiencing nocturia and incomplete bladder emptying. Tamsulosin  was previously ineffective and discontinued. Discussed trying a higher dose of tamsulosin  to improve symptoms. Potential side effect of hypotension noted, but current blood pressure is stable. - Start tamsulosin  0.8 mg daily - Monitor for side effects, particularly hypotension - Follow up with urology as needed  Mixed hyperlipidemia Currently managed with lifestyle modifications. - Order fasting lipid panel  Gastroesophageal reflux disease GERD symptoms are controlled with omeprazole  20 mg daily. - Continue omeprazole  20 mg daily  General Health Maintenance Planning to receive the pneumococcal conjugate vaccine 20-valent for prophylaxis. - Administer pneumococcal conjugate vaccine 20-valent      Medications Discontinued During This Encounter  Medication Reason   tamsulosin  (FLOMAX ) 0.4 MG CAPS capsule Reorder   colchicine  0.6 MG tablet Reorder    Return in about 3 months (around  10/08/2024) for fasting labs, gout, BPH.        Subjective:   Encounter date: 07/09/2024  Muhamad Chevalier is a 63 y.o. male who has Essential hypertension, benign; Idiopathic gout of right foot; Gastroesophageal reflux disease; Hyperlipidemia; Chest pain; DOE (dyspnea on exertion); Lower extremity edema; Palpitations; Class 1 obesity due to excess calories with serious comorbidity and body mass index (BMI) of 30.0 to 30.9 in adult; Presbyopia; Elevated PSA; and BPH with obstruction/lower urinary tract symptoms on their problem list..   He  has a past medical history of Arthritis, Chest pain, DOE (dyspnea on exertion), Essential hypertension, benign (11/15/2013), GERD (gastroesophageal reflux disease), and Hyperlipidemia (05/13/2022).SABRA   He presents with chief complaint of Hypertension (6 month follow up HTN and HLD. Pt is fasting today.//HM Due- Prevnar 20 vaccine ) .   Discussed the use of AI scribe software for clinical note transcription with the patient, who gave verbal consent to proceed.  History of Present Illness Christop Thall is a 63 year old male with hypertension who presents for blood pressure follow-up.  He is currently on amlodipine  10 mg daily and olmesartan -hydrochlorothiazide  40-25 mg daily for hypertension management.  He has a history of gout and hyperuricemia, with two severe gout flares in his lifetime, the most recent occurring three to four months ago. The flares primarily affect his right big toe and the back of his heel, causing significant pain that sometimes prevents movement. He manages acute flares with colchicine  as needed and has also used indomethacin in the past. His last uric acid level was 9.3 mg/dL.  He has GERD, which is controlled with omeprazole  20 mg daily.  He has benign prostatic hyperplasia (BPH) and was taking tamsulosin  0.4 mg daily, but stopped due to lack of significant improvement in symptoms,  particularly nocturia. He has been using  over-the-counter supplements instead. He follows with urology and had a recent biopsy that was negative for prostate cancer.  He has mixed hyperlipidemia, which is currently managed with lifestyle modifications rather than medications. He is due for a repeat fasting lipid panel.     ROS  Past Surgical History:  Procedure Laterality Date   FRACTURE SURGERY     HIP SURGERY     Fractured hip    Outpatient Medications Prior to Visit  Medication Sig Dispense Refill   ALPRAZolam  (XANAX ) 1 MG tablet Take 1 tablet by mouth 1 hour prior to procedure 1 tablet 0   amLODipine  (NORVASC ) 10 MG tablet Take 1 tablet (10 mg total) by mouth daily. 90 tablet 3   olmesartan -hydrochlorothiazide  (BENICAR  HCT) 40-25 MG tablet Take 1 tablet by mouth daily. 90 tablet 3   omeprazole  (PRILOSEC) 20 MG capsule Take 1 capsule (20 mg total) by mouth daily. 90 capsule 3   colchicine  0.6 MG tablet DAY 1: TAKE 2 TABLETS BY MOUTH  THEN 1 TABLET BY MOUTH AN HOUR  LATER. DAY 2 AND BEYOND: TAKE 1  TABLET BY MOUTH DAILY 30 tablet 11   tamsulosin  (FLOMAX ) 0.4 MG CAPS capsule Take 1 capsule (0.4 mg total) by mouth daily. 30 capsule 11   No facility-administered medications prior to visit.    Family History  Problem Relation Age of Onset   Breast cancer Mother    Stroke Father    Hypertension Father    Cancer Neg Hx    Diabetes Neg Hx    Early death Neg Hx    Heart disease Neg Hx    Hyperlipidemia Neg Hx    Kidney disease Neg Hx    Alcohol abuse Neg Hx    Drug abuse Neg Hx    Colon cancer Neg Hx    Esophageal cancer Neg Hx    Liver cancer Neg Hx    Pancreatic cancer Neg Hx    Stomach cancer Neg Hx    Rectal cancer Neg Hx     Social History   Socioeconomic History   Marital status: Married    Spouse name: Not on file   Number of children: 0   Years of education: Not on file   Highest education level: Bachelor's degree (e.g., BA, AB, BS)  Occupational History   Occupation: Works for [employer] of  [employer]  Tobacco Use   Smoking status: Never   Smokeless tobacco: Never  Vaping Use   Vaping status: Never Used  Substance and Sexual Activity   Alcohol use: No   Drug use: No   Sexual activity: Not Currently  Other Topics Concern   Not on file  Social History Narrative   Not on file   Social Drivers of Health   Financial Resource Strain: Low Risk  (07/08/2024)   Overall Financial Resource Strain (CARDIA)    Difficulty of Paying Living Expenses: Not hard at all  Food Insecurity: No Food Insecurity (07/08/2024)   Hunger Vital Sign    Worried About Running Out of Food in the Last Year: Never true    Ran Out of Food in the Last Year: Never true  Transportation Needs: No Transportation Needs (07/08/2024)   PRAPARE - Administrator, Civil Service (Medical): No    Lack of Transportation (Non-Medical): No  Physical Activity: Inactive (07/08/2024)   Exercise Vital Sign    Days of Exercise per Week: 0 days    Minutes of  Exercise per Session: Not on file  Stress: No Stress Concern Present (07/08/2024)   [employer] of Occupational Health - Occupational Stress Questionnaire    Feeling of Stress: Not at all  Social Connections: Socially Integrated (07/08/2024)   Social Connection and Isolation Panel    Frequency of Communication with Friends and Family: More than three times a week    Frequency of Social Gatherings with Friends and Family: More than three times a week    Attends Religious Services: More than 4 times per year    Active Member of [employer] or Organizations: Yes    Attends Engineer, structural: More than 4 times per year    Marital Status: Married  Catering manager Violence: Not on file                                                                                                  Objective:  Physical Exam: BP 110/74 (BP Location: Left Arm, Patient Position: Sitting, Cuff Size: Large)   Pulse 83   Temp (!) 97.1 F (36.2 C) (Temporal)    Resp 18   Wt 207 lb 12.8 oz (94.3 kg)   SpO2 100%   BMI 32.55 kg/m    Physical Exam VITALS: BP- 110/74 GENERAL: Alert, cooperative, well developed, no acute distress. HEENT: Normocephalic, normal oropharynx, moist mucous membranes. CHEST: Clear to auscultation bilaterally, no wheezes, rhonchi, or crackles. CARDIOVASCULAR: Normal heart rate and rhythm, S1 and S2 normal without murmurs. ABDOMEN: Soft, non-tender, non-distended, without organomegaly, normal bowel sounds. EXTREMITIES: No cyanosis or edema. NEUROLOGICAL: Cranial nerves grossly intact, moves all extremities without gross motor or sensory deficit.   Physical Exam  US  Guided Needle Placement Result Date: 05/07/2024 CLINICAL DATA:  Ultrasound was provided for use by the ordering physician.  No provider Interpretation or professional fees incurred.    US  Transrectal Complete Result Date: 05/07/2024 Please see Notes tab for imaging impression.  US  PROSTATE BIOPSY MULTIPLE Result Date: 05/07/2024 Please see Notes tab for imaging impression.   Recent Results (from the past 2160 hours)  Urinalysis, Routine w reflex microscopic     Status: Abnormal   Collection Time: 05/07/24 12:00 AM  Result Value Ref Range   Specific Gravity, UA 1.015 1.005 - 1.030   pH, UA 7.0 5.0 - 7.5   Color, UA Yellow Yellow   Appearance Ur Clear Clear   Leukocytes,UA Negative Negative   Protein,UA Negative Negative/Trace   Glucose, UA Negative Negative   Ketones, UA Negative Negative   RBC, UA Trace (A) Negative   Bilirubin, UA Negative Negative   Urobilinogen, Ur 0.2 0.2 - 1.0 mg/dL   Nitrite, UA Negative Negative   Microscopic Examination See below:   Microscopic Examination     Status: None   Collection Time: 05/07/24 12:00 AM   Urine  Result Value Ref Range   WBC, UA 0-5 0 - 5 /hpf   RBC, Urine 0-2 0 - 2 /hpf   Epithelial Cells (non renal) 0-10 0 - 10 /hpf   Bacteria, UA Few None seen/Few        Beverley  Adine Hummer, MD, MS

## 2024-07-09 NOTE — Patient Instructions (Signed)
  VISIT SUMMARY: You came in today for a follow-up on your blood pressure and to discuss other ongoing health issues. Your blood pressure is well-controlled with your current medications. We also reviewed your history of gout, GERD, BPH, and mixed hyperlipidemia, and made some adjustments to your treatment plan.  YOUR PLAN: -ESSENTIAL HYPERTENSION: Your blood pressure is well-controlled at 110/74 mmHg with your current medications. Continue taking amlodipine  10 mg daily and olmesartan -hydrochlorothiazide  40-25 mg daily. We will also check a complete metabolic panel to monitor your overall health.  -IDIOPATHIC CHRONIC GOUT: Gout is a type of arthritis caused by high levels of uric acid in the blood, leading to painful joint flares. You had a recent flare and your uric acid level was 9.3 mg/dL. We will start you on allopurinol  300 mg daily to lower your uric acid levels, and you should take colchicine  daily for the first 3-6 months to prevent flares. We will recheck your uric acid level in 3 months.  -BENIGN PROSTATIC HYPERPLASIA (BPH): BPH is an enlargement of the prostate gland that can cause urinary symptoms. You have been experiencing nocturia and incomplete bladder emptying. We will start you on a higher dose of tamsulosin  at 0.8 mg daily to improve your symptoms. Monitor for side effects like low blood pressure and follow up with urology as needed.  -MIXED HYPERLIPIDEMIA: Mixed hyperlipidemia is a condition with high levels of different types of fats in the blood. You are managing this with lifestyle changes. We will order a fasting lipid panel to check your current levels.  -GASTROESOPHAGEAL REFLUX DISEASE (GERD): GERD is a condition where stomach acid frequently flows back into the tube connecting your mouth and stomach, causing heartburn. Your symptoms are controlled with omeprazole  20 mg daily. Continue taking this medication.  -GENERAL HEALTH MAINTENANCE: We will administer the pneumococcal  conjugate vaccine 20-valent to help protect you against pneumonia.  INSTRUCTIONS: Please follow up in 3 months to recheck your uric acid levels and monitor your response to the new medications. Continue to monitor your blood pressure at home and report any significant changes. Schedule a fasting lipid panel as soon as possible. Follow up with urology as needed for your BPH symptoms.

## 2024-07-09 NOTE — Addendum Note (Signed)
 Addended by: EUGENIE ULANDA CROME on: 07/09/2024 10:40 AM   Modules accepted: Orders

## 2024-07-17 ENCOUNTER — Ambulatory Visit: Payer: Self-pay | Admitting: Family Medicine

## 2024-07-17 DIAGNOSIS — E782 Mixed hyperlipidemia: Secondary | ICD-10-CM

## 2024-07-17 MED ORDER — ROSUVASTATIN CALCIUM 10 MG PO TABS: 10.0000 mg | ORAL_TABLET | Freq: Every day | ORAL | 3 refills | Status: DC

## 2024-09-12 ENCOUNTER — Ambulatory Visit
Admission: RE | Admit: 2024-09-12 | Discharge: 2024-09-12 | Disposition: A | Discharge status: Home / Self Care | Attending: Emergency Medicine | Admitting: Emergency Medicine

## 2024-09-12 ENCOUNTER — Other Ambulatory Visit: Payer: Self-pay

## 2024-09-12 VITALS — BP 114/62 | HR 86 | Temp 98.1°F | Resp 18 | Ht 67.0 in | Wt 205.0 lb

## 2024-09-12 DIAGNOSIS — J069 Acute upper respiratory infection, unspecified: Secondary | ICD-10-CM | POA: Diagnosis present

## 2024-09-12 DIAGNOSIS — J029 Acute pharyngitis, unspecified: Secondary | ICD-10-CM | POA: Diagnosis present

## 2024-09-12 LAB — POC COVID19/FLU A&B COMBO
Covid Antigen, POC: NEGATIVE
Influenza A Antigen, POC: NEGATIVE
Influenza B Antigen, POC: NEGATIVE

## 2024-09-12 LAB — POCT RAPID STREP A (OFFICE): Rapid Strep A Screen: NEGATIVE

## 2024-09-12 MED ORDER — PROMETHAZINE-DM 6.25-15 MG/5ML PO SYRP: 5.0000 mL | ORAL_SOLUTION | Freq: Four times a day (QID) | ORAL | 0 refills | Status: AC | PRN

## 2024-09-12 NOTE — ED Triage Notes (Addendum)
 Pt presents with a chief complaint of fever x 2 days. Recently returned from [place] on Saturday 11/1. Fever is accompanied with cough, sore throat, and lower back pain. Currently rates overall pain a 3/10. Symptoms/pain are worse in the morning time. OTC medication similar to Tylenol taken for symptoms/pain with improvement. At-home COVID test taken on Tuesday 11/4 and it was negative.

## 2024-09-12 NOTE — ED Provider Notes (Signed)
 [facility]    CSN: [id number] Arrival date & time: 09/12/24  1201      History   Chief Complaint Chief Complaint  Patient presents with   Fever    sore throat, coughing with phlegm, lower back pain - Entered by patient    HPI Levi Taylor is a 63 y.o. male.  2 day history of fever, sore throat, cough, and some body aches Tmax 100, used forehead thermometer  Feels like his tonsils are large  Symptoms reported worse in the mornings upon wakening  No shortness of breath, wheezing, chest tightness  Has used coricidin, paracetamol   Recently returned from trip to [place], 4 days before symptoms began  Home covid test 2 days ago was negative   Past Medical History:  Diagnosis Date   Arthritis    Chest pain    DOE (dyspnea on exertion)    Essential hypertension, benign 11/15/2013   GERD (gastroesophageal reflux disease)    Hyperlipidemia 05/13/2022    Patient Active Problem List   Diagnosis Date Noted   Elevated PSA 02/28/2024   BPH with obstruction/lower urinary tract symptoms 02/28/2024   Class 1 obesity due to excess calories with serious comorbidity and body mass index (BMI) of 30.0 to 30.9 in adult 01/16/2024   Presbyopia 01/16/2024   Lower extremity edema 05/08/2023   Palpitations 05/08/2023   Chest pain 01/23/2023   DOE (dyspnea on exertion) 01/23/2023   Idiopathic gout of right foot 05/13/2022   Gastroesophageal reflux disease 05/13/2022   Hyperlipidemia 05/13/2022   Essential hypertension, benign 11/15/2013    Past Surgical History:  Procedure Laterality Date   FRACTURE SURGERY     HIP SURGERY     Fractured hip       Home Medications    Prior to Admission medications   Medication Sig Start Date End Date Taking? Authorizing Provider  promethazine -dextromethorphan (PROMETHAZINE -DM) 6.25-15 MG/5ML syrup Take 5 mLs by mouth 4 (four) times daily as needed for cough. 09/12/24  Yes Ajax Schroll, Asberry, PA-C  allopurinol  (ZYLOPRIM ) 300 MG  tablet Take 1 tablet (300 mg total) by mouth daily. 07/09/24 01/05/25  Sebastian Beverley NOVAK, MD  ALPRAZolam  (XANAX ) 1 MG tablet Take 1 tablet by mouth 1 hour prior to procedure 04/09/24   Stoneking, Adine PARAS., MD  amLODipine  (NORVASC ) 10 MG tablet Take 1 tablet (10 mg total) by mouth daily. 01/15/24 01/09/25  Sebastian Beverley NOVAK, MD  colchicine  0.6 MG tablet Take 1 tablet (0.6 mg total) by mouth daily. 07/09/24 10/07/24  Sebastian Beverley NOVAK, MD  olmesartan -hydrochlorothiazide  (BENICAR  HCT) 40-25 MG tablet Take 1 tablet by mouth daily. 01/15/24 01/09/25  Sebastian Beverley NOVAK, MD  omeprazole  (PRILOSEC) 20 MG capsule Take 1 capsule (20 mg total) by mouth daily. 01/15/24   Sebastian Beverley NOVAK, MD  rosuvastatin  (CRESTOR ) 10 MG tablet Take 1 tablet (10 mg total) by mouth daily. 07/17/24   Sebastian Beverley NOVAK, MD  tamsulosin  (FLOMAX ) 0.4 MG CAPS capsule Take 2 capsules (0.8 mg total) by mouth daily after breakfast. 07/09/24 07/04/25  Sebastian Beverley NOVAK, MD    Family History Family History  Problem Relation Age of Onset   Breast cancer Mother    Stroke Father    Hypertension Father    Cancer Neg Hx    Diabetes Neg Hx    Early death Neg Hx    Heart disease Neg Hx    Hyperlipidemia Neg Hx    Kidney disease Neg Hx    Alcohol abuse Neg Hx  Drug abuse Neg Hx    Colon cancer Neg Hx    Esophageal cancer Neg Hx    Liver cancer Neg Hx    Pancreatic cancer Neg Hx    Stomach cancer Neg Hx    Rectal cancer Neg Hx     Social History Social History   Tobacco Use   Smoking status: Never   Smokeless tobacco: Never  Vaping Use   Vaping status: Never Used  Substance Use Topics   Alcohol use: No   Drug use: No     Allergies   Pantoprazole   Review of Systems Review of Systems As per HPI  Physical Exam Triage Vital Signs ED Triage Vitals  Encounter Vitals Group     BP 09/12/24 1311 94/63     Girls Systolic BP Percentile --      Girls Diastolic BP Percentile --      Boys Systolic BP Percentile --      Boys  Diastolic BP Percentile --      Pulse Rate 09/12/24 1311 86     Resp 09/12/24 1311 18     Temp 09/12/24 1311 98.1 F (36.7 C)     Temp Source 09/12/24 1311 Oral     SpO2 09/12/24 1311 97 %     Weight 09/12/24 1309 205 lb (93 kg)     Height 09/12/24 1309 5' 7 (1.702 m)     Head Circumference --      Peak Flow --      Pain Score 09/12/24 1309 3     Pain Loc --      Pain Education --      Exclude from Growth Chart --    No data found.  Updated Vital Signs BP 114/62 (BP Location: Left Arm)   Pulse 86   Temp 98.1 F (36.7 C) (Oral)   Resp 18   Ht 5' 7 (1.702 m)   Wt 205 lb (93 kg)   SpO2 97%   BMI 32.11 kg/m   Visual Acuity Right Eye Distance:   Left Eye Distance:   Bilateral Distance:    Right Eye Near:   Left Eye Near:    Bilateral Near:     Physical Exam Vitals and nursing note reviewed.  Constitutional:      Appearance: He is not ill-appearing.  HENT:     Right Ear: Tympanic membrane and ear canal normal.     Left Ear: Tympanic membrane and ear canal normal.     Nose: No rhinorrhea.     Mouth/Throat:     Mouth: Mucous membranes are moist.     Pharynx: Oropharynx is clear. Uvula midline. Posterior oropharyngeal erythema present. No uvula swelling.     Tonsils: No tonsillar exudate or tonsillar abscesses. 1+ on the right. 1+ on the left.     Comments: Normal phonation, tolerating secretions  Eyes:     Conjunctiva/sclera: Conjunctivae normal.  Cardiovascular:     Rate and Rhythm: Normal rate and regular rhythm.     Pulses: Normal pulses.     Heart sounds: Normal heart sounds.  Pulmonary:     Effort: Pulmonary effort is normal. No respiratory distress.     Breath sounds: Normal breath sounds. No wheezing, rhonchi or rales.  Abdominal:     Palpations: Abdomen is soft.     Tenderness: There is no abdominal tenderness.  Musculoskeletal:     Cervical back: Normal range of motion.  Lymphadenopathy:     Cervical: No cervical  adenopathy.  Skin:    General:  Skin is warm and dry.  Neurological:     Mental Status: He is alert and oriented to person, place, and time.     UC Treatments / Results  Labs (all labs ordered are listed, but only abnormal results are displayed) Labs Reviewed  CULTURE, GROUP A STREP ([facility])  POC COVID19/FLU A&B COMBO  POCT RAPID STREP A (OFFICE)    EKG  Radiology No results found.  Procedures Procedures   Medications Ordered in UC Medications - No data to display  Initial Impression / Assessment and Plan / UC Course  I have reviewed the triage vital signs and the nursing notes.  Pertinent labs & imaging results that were available during my care of the patient were reviewed by me and considered in my medical decision making (see chart for details).  Afebrile in clinic today Rapid flu/covid is negative Rapid strep negative, will culture Advised supportive care, OTC options and prescriptions discussed. Advised likely viral prognosis, reasons to return to clinic discussed. Patient is agreeable with plan, no questions at this time  Final Clinical Impressions(s) / UC Diagnoses   Final diagnoses:  Viral URI  Sore throat     Discharge Instructions      Strep test was negative. Flu and covid were also negative. You likely have a viral illness.  We will call you if anything returns on your throat culture (2-3 days) In the meantime, supportive care Ibuprofen  can be alternated with tylenol for pain/fever Make sure you are drinking lots of fluids! Salt water gargles, lozenges, or throat spray  The promethazine  DM cough syrup can be used up to 4 times daily. If this medication makes you drowsy, take only once before bed.  Allow 4-5 days for improvement in symptoms      ED Prescriptions     Medication Sig Dispense Auth. Provider   promethazine -dextromethorphan (PROMETHAZINE -DM) 6.25-15 MG/5ML syrup Take 5 mLs by mouth 4 (four) times daily as needed for cough. 240 mL Annisa Mazzarella, Asberry, PA-C       PDMP not reviewed this encounter.   Sheniece Ruggles, Asberry, PA-C 09/12/24 1400

## 2024-09-12 NOTE — Discharge Instructions (Addendum)
 Strep test was negative. Flu and covid were also negative. You likely have a viral illness.  We will call you if anything returns on your throat culture (2-3 days) In the meantime, supportive care Ibuprofen  can be alternated with tylenol for pain/fever Make sure you are drinking lots of fluids! Salt water gargles, lozenges, or throat spray  The promethazine  DM cough syrup can be used up to 4 times daily. If this medication makes you drowsy, take only once before bed.  Allow 4-5 days for improvement in symptoms

## 2024-09-14 ENCOUNTER — Other Ambulatory Visit: Payer: Self-pay | Admitting: Family Medicine

## 2024-09-14 DIAGNOSIS — M1A0710 Idiopathic chronic gout, right ankle and foot, without tophus (tophi): Secondary | ICD-10-CM

## 2024-09-15 LAB — CULTURE, GROUP A STREP (THRC)

## 2024-09-16 ENCOUNTER — Ambulatory Visit: Payer: Self-pay | Admitting: Emergency Medicine

## 2024-10-08 ENCOUNTER — Ambulatory Visit: Admitting: Family Medicine

## 2024-10-08 ENCOUNTER — Encounter: Payer: Self-pay | Admitting: Family Medicine

## 2024-10-08 VITALS — BP 126/76 | HR 93 | Temp 97.8°F | Ht 67.0 in | Wt 210.8 lb

## 2024-10-08 DIAGNOSIS — M1A0710 Idiopathic chronic gout, right ankle and foot, without tophus (tophi): Secondary | ICD-10-CM

## 2024-10-08 DIAGNOSIS — E782 Mixed hyperlipidemia: Secondary | ICD-10-CM

## 2024-10-08 DIAGNOSIS — J029 Acute pharyngitis, unspecified: Secondary | ICD-10-CM

## 2024-10-08 DIAGNOSIS — N138 Other obstructive and reflux uropathy: Secondary | ICD-10-CM

## 2024-10-08 DIAGNOSIS — N401 Enlarged prostate with lower urinary tract symptoms: Secondary | ICD-10-CM

## 2024-10-08 DIAGNOSIS — K219 Gastro-esophageal reflux disease without esophagitis: Secondary | ICD-10-CM | POA: Diagnosis not present

## 2024-10-08 LAB — LIPID PANEL
Cholesterol: 115 mg/dL (ref 0–200)
HDL: 49.4 mg/dL (ref >39.00)
LDL Cholesterol: 45 mg/dL (ref 0–99)
NonHDL: 65.42
Total CHOL/HDL Ratio: 2
Triglycerides: 102 mg/dL (ref 0.0–149.0)
VLDL: 20.4 mg/dL (ref 0.0–40.0)

## 2024-10-08 LAB — URIC ACID: Uric Acid, Serum: 3.9 mg/dL — ABNORMAL LOW (ref 4.0–7.8)

## 2024-10-08 LAB — PSA: PSA: 6.12 ng/mL — ABNORMAL HIGH (ref 0.10–4.00)

## 2024-10-08 MED ORDER — OMEPRAZOLE 20 MG PO CPDR: 20.0000 mg | DELAYED_RELEASE_CAPSULE | Freq: Two times a day (BID) | ORAL | 3 refills | Status: AC

## 2024-10-08 MED ORDER — ROSUVASTATIN CALCIUM 10 MG PO TABS: 10.0000 mg | ORAL_TABLET | Freq: Every day | ORAL | 3 refills | Status: AC

## 2024-10-08 MED ORDER — ROSUVASTATIN CALCIUM 10 MG PO TABS: 10.0000 mg | ORAL_TABLET | Freq: Every day | ORAL | 3 refills | Status: DC

## 2024-10-08 NOTE — Progress Notes (Signed)
 Assessment & Plan   Assessment/Plan:   Assessment and Plan Assessment & Plan Gout Intermittent ankle pain with occasional pinching sensation, no recent severe flares. Previous uric acid level was elevated at 10.3 mg/dL in September. Currently on allopurinol  300 mg daily and colchicine  as needed. - Checked uric acid levels to assess response to treatment.  Benign prostatic hyperplasia with lower urinary tract symptoms No significant nocturia or urinary retention. Scheduled follow-up with urology in January for PSA evaluation. - Continue tamsulosin  0.8 mg daily. - Follow up with urology in January for PSA evaluation.  Acute pharyngitis, resolving Recent severe sore throat treated with doxycycline. Symptoms have improved but occasional discomfort persists. Possible contribution from GERD. - Continue to monitor symptoms and consider further evaluation if symptoms persist.  Gastroesophageal reflux disease and hiatal hernia Chronic GERD with occasional exacerbations. Currently on omeprazole  20 mg daily. Recent exacerbation possibly related to antibiotic use. Hiatal hernia noted on previous endoscopy. - Increased omeprazole  to 20 mg twice daily before meals. - Continue to monitor symptoms and adjust treatment as needed.  Mixed hyperlipidemia Currently on rosuvastatin  10 mg daily. Reports lower back pain and numbness, possibly related to statin use. Plan to reduce dose to assess for symptom improvement. - Reduce rosuvastatin  to 5 mg daily for one month to assess for symptom improvement. - Monitor cholesterol levels and symptoms.  Hypertension Blood pressure is well-controlled at 126/76 mmHg. Currently on amlodipine  and olmesartan -hctz. - Continue current antihypertensive regimen.       There are no discontinued medications.  Return in about 3 months (around 01/06/2025) for BP.        Subjective:   Encounter date: 10/08/2024  Levi Taylor is a 63 y.o. male who has Essential  hypertension, benign; Idiopathic gout of right foot; Gastroesophageal reflux disease; Hyperlipidemia; Chest pain; DOE (dyspnea on exertion); Lower extremity edema; Palpitations; Class 1 obesity due to excess calories with serious comorbidity and body mass index (BMI) of 30.0 to 30.9 in adult; Presbyopia; Elevated PSA; and BPH with obstruction/lower urinary tract symptoms on their problem list..   He  has a past medical history of Arthritis, Chest pain, DOE (dyspnea on exertion), Essential hypertension, benign (11/15/2013), GERD (gastroesophageal reflux disease), and Hyperlipidemia (05/13/2022).SABRA   He presents with chief complaint of Follow-up (Patient states he still feels gout but not as severe on right ankle. Has been having stomach issues for a while now, takes Prilosec and that is helping. 1-2 weeks ago went to emergency room due to severe soar throat. )  Discussed the use of AI scribe software for clinical note transcription with the patient, who gave verbal consent to proceed.  History of Present Illness Levi Taylor is a 63 year old male with gout and BPH who presents for follow-up.  Gout symptoms and management - Uric acid level elevated at 10.3 in September - Started on allopurinol  300 mg daily and colchicine  as needed - Gout symptoms in ankle described as mild, with a pinching sensation - No major flares recently - Symptoms do not hinder mobility  Lower urinary tract symptoms and benign prostatic hyperplasia - History of benign prostatic hyperplasia - Tamsulosin  dose increased from 0.4 mg to 0.8 mg due to initial ineffectiveness - Follow-up with urology scheduled  Pharyngitis and throat pain - Severe throat pain onset in early November after travel - Visited urgent care and emergency department - Prescribed doxycycline, which provided partial relief - Persistent throat discomfort - Believes tonsils are inflamed  Gastrointestinal symptoms and history - Ongoing  stomach issues  managed with omeprazole  20 mg daily - History of jejunitis and hiatal hernia - Upper endoscopy two years ago revealed hiatal hernia, no duodenitis - Colonoscopy performed in 2021 - Uses over-the-counter probiotics for post-antibiotic symptoms  Musculoskeletal symptoms - Lower back pain and occasional numbness - Questions possible relation to current medications  Cardiovascular risk management - On rosuvastatin  10 mg for hyperlipidemia - On amlodipine  for blood pressure management - No chest pain or shortness of breath   ROS  Past Surgical History:  Procedure Laterality Date   FRACTURE SURGERY     HIP SURGERY     Fractured hip    Current Outpatient Medications on File Prior to Visit  Medication Sig Dispense Refill   allopurinol  (ZYLOPRIM ) 300 MG tablet Take 1 tablet (300 mg total) by mouth daily. 90 tablet 1   ALPRAZolam  (XANAX ) 1 MG tablet Take 1 tablet by mouth 1 hour prior to procedure 1 tablet 0   amLODipine  (NORVASC ) 10 MG tablet Take 1 tablet (10 mg total) by mouth daily. 90 tablet 3   colchicine  0.6 MG tablet TAKE 1 TABLET BY MOUTH DAILY 90 tablet 3   olmesartan -hydrochlorothiazide  (BENICAR  HCT) 40-25 MG tablet Take 1 tablet by mouth daily. 90 tablet 3   omeprazole  (PRILOSEC) 20 MG capsule Take 1 capsule (20 mg total) by mouth daily. 90 capsule 3   promethazine -dextromethorphan (PROMETHAZINE -DM) 6.25-15 MG/5ML syrup Take 5 mLs by mouth 4 (four) times daily as needed for cough. 240 mL 0   rosuvastatin  (CRESTOR ) 10 MG tablet Take 1 tablet (10 mg total) by mouth daily. 90 tablet 3   tamsulosin  (FLOMAX ) 0.4 MG CAPS capsule Take 2 capsules (0.8 mg total) by mouth daily after breakfast. 180 capsule 3   No current facility-administered medications on file prior to visit.    Family History  Problem Relation Age of Onset   Breast cancer Mother    Stroke Father    Hypertension Father    Cancer Neg Hx    Diabetes Neg Hx    Early death Neg Hx    Heart disease Neg Hx     Hyperlipidemia Neg Hx    Kidney disease Neg Hx    Alcohol abuse Neg Hx    Drug abuse Neg Hx    Colon cancer Neg Hx    Esophageal cancer Neg Hx    Liver cancer Neg Hx    Pancreatic cancer Neg Hx    Stomach cancer Neg Hx    Rectal cancer Neg Hx     Social History   Socioeconomic History   Marital status: Married    Spouse name: Not on file   Number of children: 0   Years of education: Not on file   Highest education level: Bachelor's degree (e.g., BA, AB, BS)  Occupational History   Occupation: Works for [employer]  Tobacco Use   Smoking status: Never   Smokeless tobacco: Never  Vaping Use   Vaping status: Never Used  Substance and Sexual Activity   Alcohol use: No   Drug use: No   Sexual activity: Not Currently  Other Topics Concern   Not on file  Social History Narrative   Not on file   Social Drivers of Health   Financial Resource Strain: Medium Risk (10/07/2024)   Overall Financial Resource Strain (CARDIA)    Difficulty of Paying Living Expenses: Somewhat hard  Food Insecurity: Food Insecurity Present (10/07/2024)   Hunger Vital Sign    Worried About Running  Out of Food in the Last Year: Sometimes true    Ran Out of Food in the Last Year: Sometimes true  Transportation Needs: No Transportation Needs (10/07/2024)   PRAPARE - Administrator, Civil Service (Medical): No    Lack of Transportation (Non-Medical): No  Physical Activity: Inactive (07/08/2024)   Exercise Vital Sign    Days of Exercise per Week: 0 days    Minutes of Exercise per Session: Not on file  Stress: No Stress Concern Present (07/08/2024)   [employer] of Occupational Health - Occupational Stress Questionnaire    Feeling of Stress: Not at all  Social Connections: Socially Integrated (10/07/2024)   Social Connection and Isolation Panel    Frequency of Communication with Friends and Family: More than three times a week    Frequency of Social Gatherings with Friends  and Family: More than three times a week    Attends Religious Services: More than 4 times per year    Active Member of [employer] or Organizations: Yes    Attends Engineer, Structural: More than 4 times per year    Marital Status: Married  Catering Manager Violence: Not on file                                                                                                  Objective:  Physical Exam: BP 126/76   Pulse 93   Temp 97.8 F (36.6 C) (Oral)   Ht 5' 7 (1.702 m)   Wt 210 lb 12.8 oz (95.6 kg)   SpO2 100%   BMI 33.02 kg/m    Physical Exam           Physical Exam Constitutional:      Appearance: Normal appearance.  HENT:     Head: Normocephalic and atraumatic.     Right Ear: Hearing normal.     Left Ear: Hearing normal.     Nose: Nose normal.  Eyes:     General: No scleral icterus.       Right eye: No discharge.        Left eye: No discharge.     Extraocular Movements: Extraocular movements intact.  Cardiovascular:     Rate and Rhythm: Normal rate and regular rhythm.     Heart sounds: Normal heart sounds.  Pulmonary:     Effort: Pulmonary effort is normal.     Breath sounds: Normal breath sounds.  Abdominal:     Palpations: Abdomen is soft.     Tenderness: There is no abdominal tenderness.  Skin:    General: Skin is warm.     Findings: No rash.  Neurological:     General: No focal deficit present.     Mental Status: He is alert.     Cranial Nerves: No cranial nerve deficit.  Psychiatric:        Mood and Affect: Mood normal.        Behavior: Behavior normal.        Thought Content: Thought content normal.        Judgment: Judgment normal.  No results found.  Recent Results (from the past 2160 hours)  POC Covid19/Flu A&B Antigen     Status: None   Collection Time: 09/12/24  1:38 PM  Result Value Ref Range   Influenza A Antigen, POC Negative Negative   Influenza B Antigen, POC Negative Negative   Covid Antigen, POC Negative Negative   POCT rapid strep A     Status: None   Collection Time: 09/12/24  1:56 PM  Result Value Ref Range   Rapid Strep A Screen Negative Negative  Culture, group A strep     Status: None   Collection Time: 09/12/24  2:03 PM   Specimen: Throat  Result Value Ref Range   Specimen Description THROAT    Special Requests NONE    Culture      NO GROUP A STREP (S.PYOGENES) ISOLATED Performed at [facility], [address] [place], [place] [address]    Report Status 09/15/2024 FINAL         Beverley KATHEE Hummer, MD  I,Emily Lagle,acting as a scribe for Beverley KATHEE Hummer, MD.,have documented all relevant documentation on the behalf of Beverley KATHEE Hummer, MD.  LILLETTE Beverley KATHEE Hummer, MD, have reviewed all documentation for this visit. The documentation on 10/08/2024 for the exam, diagnosis, procedures, and orders are all accurate and complete.

## 2024-10-08 NOTE — Patient Instructions (Signed)
 It was very nice to see you today!  VISIT SUMMARY: You came in for a follow-up visit to discuss your gout, benign prostatic hyperplasia (BPH), throat pain, gastrointestinal issues, and cardiovascular health. We reviewed your current medications and made some adjustments to better manage your symptoms.  YOUR PLAN: GOUT: You have mild gout symptoms in your ankle with no recent severe flares. Your uric acid level was elevated in September. -Continue taking allopurinol  300 mg daily and colchicine  as needed. -We checked your uric acid levels to see how well the treatment is working.  BENIGN PROSTATIC HYPERPLASIA (BPH): You have a history of BPH and are experiencing lower urinary tract symptoms. -Continue taking tamsulosin  0.8 mg daily. -Follow up with urology in January for PSA evaluation.  THROAT PAIN (ACUTE PHARYNGITIS): You had severe throat pain treated with doxycycline, which has improved but still causes occasional discomfort. -Continue to monitor your symptoms. If they persist, we may need to do further evaluation.  GASTROESOPHAGEAL REFLUX DISEASE (GERD) AND HIATAL HERNIA: You have chronic GERD and a hiatal hernia, with recent symptoms possibly related to antibiotic use. -Increase omeprazole  to 20 mg twice daily before meals. -Continue to monitor your symptoms and adjust treatment as needed.  MIXED HYPERLIPIDEMIA: You are on rosuvastatin  for high cholesterol but have reported lower back pain and numbness, which may be related to the medication. -Reduce rosuvastatin  to 5 mg daily for one month to see if your symptoms improve. -Monitor your cholesterol levels and symptoms.  HYPERTENSION: Your blood pressure is well-controlled with your current medication. -Continue taking your current antihypertensive medication, amlodipine .  No follow-ups on file.   Take care, Arvella Hummer, MD, MS   PLEASE NOTE:  If you had any lab tests, please let us  know if you have not heard back within a  few days. You may see your results on mychart before we have a chance to review them but we will give you a call once they are reviewed by us .   If we ordered any referrals today, please let us  know if you have not heard from their office within the next week.   If you had any urgent prescriptions sent in today, please check with the pharmacy within an hour of our visit to make sure the prescription was transmitted appropriately.   Please try these tips to maintain a healthy lifestyle:  Eat at least 3 REAL meals and 1-2 snacks per day.  Aim for no more than 5 hours between eating.  If you eat breakfast, please do so within one hour of getting up.   Each meal should contain half fruits/vegetables, one quarter protein, and one quarter carbs (no bigger than a computer mouse)  Cut down on sweet beverages. This includes juice, soda, and sweet tea.   Drink at least 1 glass of water with each meal and aim for at least 8 glasses per day  Exercise at least 150 minutes every week.

## 2024-10-13 ENCOUNTER — Ambulatory Visit: Payer: Self-pay | Admitting: Family Medicine

## 2024-11-10 ENCOUNTER — Other Ambulatory Visit: Payer: Self-pay | Admitting: Family Medicine

## 2024-11-10 DIAGNOSIS — M1A0710 Idiopathic chronic gout, right ankle and foot, without tophus (tophi): Secondary | ICD-10-CM

## 2024-11-22 ENCOUNTER — Ambulatory Visit: Admitting: Urology

## 2024-11-22 ENCOUNTER — Encounter: Payer: Self-pay | Admitting: Urology

## 2024-11-22 VITALS — BP 135/80 | HR 91 | Ht 67.0 in | Wt 210.0 lb

## 2024-11-22 DIAGNOSIS — R338 Other retention of urine: Secondary | ICD-10-CM

## 2024-11-22 DIAGNOSIS — N138 Other obstructive and reflux uropathy: Secondary | ICD-10-CM | POA: Diagnosis not present

## 2024-11-22 DIAGNOSIS — N401 Enlarged prostate with lower urinary tract symptoms: Secondary | ICD-10-CM | POA: Diagnosis not present

## 2024-11-22 DIAGNOSIS — R972 Elevated prostate specific antigen [PSA]: Secondary | ICD-10-CM | POA: Diagnosis not present

## 2024-11-22 DIAGNOSIS — R3912 Poor urinary stream: Secondary | ICD-10-CM

## 2024-11-22 LAB — URINALYSIS, ROUTINE W REFLEX MICROSCOPIC
Bilirubin, UA: NEGATIVE
Glucose, UA: NEGATIVE
Ketones, UA: NEGATIVE
Leukocytes,UA: NEGATIVE
Nitrite, UA: NEGATIVE
Protein,UA: NEGATIVE
Specific Gravity, UA: 1.015 (ref 1.005–1.030)
Urobilinogen, Ur: 0.2 mg/dL (ref 0.2–1.0)
pH, UA: 6.5 (ref 5.0–7.5)

## 2024-11-22 LAB — MICROSCOPIC EXAMINATION: Bacteria, UA: NONE SEEN

## 2024-11-22 NOTE — Progress Notes (Signed)
 "  Assessment: 1. Elevated PSA; negative biopsy 7/25   2. BPH with obstruction/lower urinary tract symptoms     Plan: Continue tamsulosin  Return to office in 6 months  Chief Complaint:  Chief Complaint  Patient presents with   Elevated PSA    History of Present Illness:  Levi Taylor is a 64 y.o. male who is seen for further evaluation of elevated PSA. PSA results: 2/24 3.5 with 14% free 3/25 4.85  No prior history of elevated PSA.SABRA  No history of UTIs or prostatitis.  No family history of prostate cancer. No prior prostate biopsy. He has had some gradual increase in his lower urinary tract symptoms.  He reports urinary frequency, nocturia x 3, intermittent stream, and decreased force of stream.  No dysuria or gross hematuria. IPSS = 17/3. He was started on tamsulosin  in April 2025.  Prostate MRI from 04/06/2024 showed no radiographic evidence of high-grade prostate carcinoma; volume 44 ml.  He underwent a transrectal ultrasound and biopsy of the prostate on 05/07/2024. Prostate volume: 43.2 cm Biopsies showed benign prostate tissue with focal acute and chronic inflammation.  PSA results: 9/25 7.64 12/25 6.12  He returns today for follow-up.  He continues on tamsulosin  0.8 mg daily.  His lower urinary tract symptoms are fairly stable.  No dysuria or gross hematuria.  He does have some decrease stream and sensation of incomplete emptying. IPSS = 14/3.  Portions of the above documentation were copied from a prior visit for review purposes only.   Past Medical History:  Past Medical History:  Diagnosis Date   Arthritis    Chest pain    DOE (dyspnea on exertion)    Essential hypertension, benign 11/15/2013   GERD (gastroesophageal reflux disease)    Hyperlipidemia 05/13/2022    Past Surgical History:  Past Surgical History:  Procedure Laterality Date   FRACTURE SURGERY     HIP SURGERY     Fractured hip    Allergies:  Allergies  Allergen Reactions    Pantoprazole Other (See Comments)    tremors    Family History:  Family History  Problem Relation Age of Onset   Breast cancer Mother    Stroke Father    Hypertension Father    Cancer Neg Hx    Diabetes Neg Hx    Early death Neg Hx    Heart disease Neg Hx    Hyperlipidemia Neg Hx    Kidney disease Neg Hx    Alcohol abuse Neg Hx    Drug abuse Neg Hx    Colon cancer Neg Hx    Esophageal cancer Neg Hx    Liver cancer Neg Hx    Pancreatic cancer Neg Hx    Stomach cancer Neg Hx    Rectal cancer Neg Hx     Social History:  Social History   Tobacco Use   Smoking status: Never   Smokeless tobacco: Never  Vaping Use   Vaping status: Never Used  Substance Use Topics   Alcohol use: No   Drug use: No    ROS: Constitutional:  Negative for fever, chills, weight loss CV: Negative for chest pain, previous MI, hypertension Respiratory:  Negative for shortness of breath, wheezing, sleep apnea, frequent cough GI:  Negative for nausea, vomiting, bloody stool, GERD  Physical exam: BP 135/80   Pulse 91   Ht 5' 7 (1.702 m)   Wt 210 lb (95.3 kg)   BMI 32.89 kg/m  GENERAL APPEARANCE:  Well appearing, well developed,  well nourished, NAD HEENT:  Atraumatic, normocephalic, oropharynx clear NECK:  Supple without lymphadenopathy or thyromegaly ABDOMEN:  Soft, non-tender, no masses EXTREMITIES:  Moves all extremities well, without clubbing, cyanosis, or edema NEUROLOGIC:  Alert and oriented x 3, normal gait, CN II-XII grossly intact MENTAL STATUS:  appropriate BACK:  Non-tender to palpation, No CVAT SKIN:  Warm, dry, and intact   Results: U/A: 0-5 WBCs, 0-2 RBCs  "

## 2025-01-17 ENCOUNTER — Encounter: Admitting: Family Medicine

## 2025-05-23 ENCOUNTER — Ambulatory Visit: Admitting: Urology
